# Patient Record
Sex: Male | Born: 1945 | Marital: Married | Smoking: Current every day smoker
Health system: Southern US, Community
[De-identification: ages and names within clinical notes are randomized; demographics above are authoritative.]

## PROBLEM LIST (undated history)

## (undated) DIAGNOSIS — R42 Dizziness and giddiness: Secondary | ICD-10-CM

## (undated) DIAGNOSIS — C61 Malignant neoplasm of prostate: Secondary | ICD-10-CM

## (undated) DIAGNOSIS — I10 Essential (primary) hypertension: Secondary | ICD-10-CM

## (undated) DIAGNOSIS — R0602 Shortness of breath: Secondary | ICD-10-CM

## (undated) DIAGNOSIS — H9191 Unspecified hearing loss, right ear: Secondary | ICD-10-CM

## (undated) DIAGNOSIS — E785 Hyperlipidemia, unspecified: Secondary | ICD-10-CM

## (undated) HISTORY — DX: Hyperlipidemia, unspecified: E78.5

## (undated) HISTORY — DX: Unspecified hearing loss, right ear: H91.91

## (undated) HISTORY — DX: Malignant neoplasm of prostate: C61

## (undated) HISTORY — DX: Shortness of breath: R06.02

## (undated) HISTORY — DX: Dizziness and giddiness: R42

## (undated) HISTORY — DX: Essential (primary) hypertension: I10

## (undated) HISTORY — PX: DENTAL SURGERY: SHX609

---

## 2018-08-25 LAB — BASIC METABOLIC PANEL
CO2: 23 — AB (ref 13–22)
Chloride: 108 (ref 99–108)
Creatinine: 0.9 (ref 0.6–1.3)
Potassium: 4.4 (ref 3.4–5.3)
Sodium: 140 (ref 137–147)

## 2018-08-25 LAB — HEPATIC FUNCTION PANEL
ALT: 6 — AB (ref 10–40)
AST: 10 — AB (ref 14–40)

## 2018-08-25 LAB — COMPREHENSIVE METABOLIC PANEL
Albumin: 4.1 (ref 3.5–5.0)
Calcium: 9.2 (ref 8.7–10.7)

## 2018-08-25 LAB — LIPID PANEL
Cholesterol: 117 (ref 0–200)
HDL: 40 (ref 35–70)
LDL Cholesterol: 64
LDl/HDL Ratio: 2.9
Triglycerides: 58 (ref 40–160)

## 2018-08-25 LAB — PSA: PSA: 0.1

## 2019-06-28 ENCOUNTER — Encounter: Payer: Self-pay | Admitting: Family Medicine

## 2019-06-28 ENCOUNTER — Other Ambulatory Visit: Payer: Self-pay

## 2019-06-28 ENCOUNTER — Ambulatory Visit (INDEPENDENT_AMBULATORY_CARE_PROVIDER_SITE_OTHER): Payer: Medicare HMO | Admitting: Family Medicine

## 2019-06-28 VITALS — BP 136/68 | HR 80 | Temp 98.1°F | Ht 72.0 in | Wt 185.0 lb

## 2019-06-28 DIAGNOSIS — Z122 Encounter for screening for malignant neoplasm of respiratory organs: Secondary | ICD-10-CM

## 2019-06-28 DIAGNOSIS — Z720 Tobacco use: Secondary | ICD-10-CM | POA: Insufficient documentation

## 2019-06-28 DIAGNOSIS — I6522 Occlusion and stenosis of left carotid artery: Secondary | ICD-10-CM | POA: Diagnosis not present

## 2019-06-28 DIAGNOSIS — H9190 Unspecified hearing loss, unspecified ear: Secondary | ICD-10-CM | POA: Insufficient documentation

## 2019-06-28 DIAGNOSIS — J449 Chronic obstructive pulmonary disease, unspecified: Secondary | ICD-10-CM

## 2019-06-28 DIAGNOSIS — E782 Mixed hyperlipidemia: Secondary | ICD-10-CM

## 2019-06-28 DIAGNOSIS — Z8546 Personal history of malignant neoplasm of prostate: Secondary | ICD-10-CM

## 2019-06-28 DIAGNOSIS — H9191 Unspecified hearing loss, right ear: Secondary | ICD-10-CM

## 2019-06-28 DIAGNOSIS — I10 Essential (primary) hypertension: Secondary | ICD-10-CM

## 2019-06-28 DIAGNOSIS — E785 Hyperlipidemia, unspecified: Secondary | ICD-10-CM | POA: Insufficient documentation

## 2019-06-28 DIAGNOSIS — Z87891 Personal history of nicotine dependence: Secondary | ICD-10-CM

## 2019-06-28 DIAGNOSIS — I6529 Occlusion and stenosis of unspecified carotid artery: Secondary | ICD-10-CM | POA: Insufficient documentation

## 2019-06-28 LAB — COMPREHENSIVE METABOLIC PANEL
ALT: 8 U/L (ref 0–53)
AST: 10 U/L (ref 0–37)
Albumin: 4.4 g/dL (ref 3.5–5.2)
Alkaline Phosphatase: 81 U/L (ref 39–117)
BUN: 15 mg/dL (ref 6–23)
CO2: 28 mEq/L (ref 19–32)
Calcium: 9.6 mg/dL (ref 8.4–10.5)
Chloride: 105 mEq/L (ref 96–112)
Creatinine, Ser: 0.85 mg/dL (ref 0.40–1.50)
GFR: 87.75 mL/min (ref >60.00)
Glucose, Bld: 101 mg/dL — ABNORMAL HIGH (ref 70–99)
Potassium: 4.5 mEq/L (ref 3.5–5.1)
Sodium: 139 mEq/L (ref 135–145)
Total Bilirubin: 0.9 mg/dL (ref 0.2–1.2)
Total Protein: 7 g/dL (ref 6.0–8.3)

## 2019-06-28 LAB — LIPID PANEL
Cholesterol: 126 mg/dL (ref 0–200)
HDL: 40.8 mg/dL (ref >39.00)
LDL Cholesterol: 73 mg/dL (ref 0–99)
NonHDL: 84.86
Total CHOL/HDL Ratio: 3
Triglycerides: 61 mg/dL (ref 0.0–149.0)
VLDL: 12.2 mg/dL (ref 0.0–40.0)

## 2019-06-28 LAB — PSA: PSA: 0.09 ng/mL — ABNORMAL LOW (ref 0.10–4.00)

## 2019-06-28 MED ORDER — AMLODIPINE-ATORVASTATIN 10-10 MG PO TABS: 1.0000 | ORAL_TABLET | Freq: Every day | ORAL | 3 refills | Status: DC

## 2019-06-28 MED ORDER — TAMSULOSIN HCL 0.4 MG PO CAPS: 0.4000 mg | ORAL_CAPSULE | Freq: Every day | ORAL | 3 refills | Status: DC

## 2019-06-28 NOTE — Assessment & Plan Note (Signed)
Notes improvement in congestion since quitting smoking. Has never need inhalers. Will continue to monitor and get lung cancer screening.

## 2019-06-28 NOTE — Assessment & Plan Note (Signed)
Last lipids at goal. Repeat today. Cont atrovastatin

## 2019-06-28 NOTE — Assessment & Plan Note (Signed)
Last PSA 0.1. Will repeat today

## 2019-06-28 NOTE — Patient Instructions (Signed)
#  Referral I have placed a referral to a specialist for you. You should receive a phone call from the specialty office. Make sure your voicemail is not full and that if you are able to answer your phone to unknown or new numbers.   It may take up to 2 weeks to hear about the referral. If you do not hear anything in 2 weeks, please call our office and ask to speak with the referral coordinator.    Great to meet you!  Labs today

## 2019-06-28 NOTE — Progress Notes (Signed)
Results letter to patient

## 2019-06-28 NOTE — Assessment & Plan Note (Addendum)
Reviewed last years imaging with 40-50% stenosis and recommendation for close f/u. Endorses some improvement in dizziness symptoms. Will repeat as follow-up imaging recommended. Given symptoms if worsened will refer to vascular.

## 2019-06-28 NOTE — Progress Notes (Signed)
Subjective:     Christian Gordon is a 74 y.o. male presenting for Establish Care     HPI  #Carotid Artery issue - taking a baby aspirin daily - was told to recheck - got an ultrasound - in 10/2018  #Hx of tobacco use - using a rolled up piece - chest congestion improved dramatically after stopping - was getting CXR every few years   #HTN - had a 30 day supply of medications sent, but did not want to pick this up - takes medication - does not check at home - no symptoms today  Review of Systems   Social History   Tobacco Use  Smoking Status Former Smoker  . Packs/day: 1.00  . Years: 60.00  . Pack years: 60.00  . Types: Cigarettes  . Start date: 28  . Quit date: 03/03/2019  . Years since quitting: 0.3  Smokeless Tobacco Never Used        Objective:    BP Readings from Last 3 Encounters:  06/28/19 136/68   Wt Readings from Last 3 Encounters:  06/28/19 185 lb (83.9 kg)    BP 136/68   Pulse 80   Temp 98.1 F (36.7 C) (Temporal)   Ht 6' (1.829 m)   Wt 185 lb (83.9 kg)   SpO2 95%   BMI 25.09 kg/m    Physical Exam Constitutional:      Appearance: Normal appearance. He is not ill-appearing or diaphoretic.  HENT:     Right Ear: External ear normal.     Left Ear: External ear normal.     Nose: Nose normal.  Eyes:     General: No scleral icterus.    Extraocular Movements: Extraocular movements intact.     Conjunctiva/sclera: Conjunctivae normal.  Cardiovascular:     Rate and Rhythm: Normal rate and regular rhythm.     Heart sounds: No murmur.  Pulmonary:     Effort: Pulmonary effort is normal. No respiratory distress.     Breath sounds: Normal breath sounds. No wheezing.  Musculoskeletal:     Cervical back: Neck supple.  Skin:    General: Skin is warm and dry.  Neurological:     Mental Status: He is alert. Mental status is at baseline.  Psychiatric:        Mood and Affect: Mood normal.           Assessment & Plan:   Problem  List Items Addressed This Visit      Cardiovascular and Mediastinum   Carotid artery stenosis - Primary    Reviewed last years imaging with 40-50% stenosis and recommendation for close f/u. Endorses some improvement in dizziness symptoms. Will repeat as follow-up imaging recommended. Given symptoms if worsened will refer to vascular.       Relevant Medications   amlodipine-atorvastatin (CADUET) 10-10 MG tablet   Other Relevant Orders   VAS US CAROTID   Hypertension    BP at goal. Cont amlodipine      Relevant Medications   amlodipine-atorvastatin (CADUET) 10-10 MG tablet   Other Relevant Orders   Comprehensive metabolic panel     Respiratory   COPD (chronic obstructive pulmonary disease) ([place])    Notes improvement in congestion since quitting smoking. Has never need inhalers. Will continue to monitor and get lung cancer screening.         Nervous and Auditory   Deafness     Other   History of prostate cancer    Last PSA 0.1. Will  repeat today      Relevant Medications   tamsulosin (FLOMAX) 0.4 MG CAPS capsule   Other Relevant Orders   PSA   Hyperlipidemia    Last lipids at goal. Repeat today. Cont atrovastatin      Relevant Medications   amlodipine-atorvastatin (CADUET) 10-10 MG tablet   Other Relevant Orders   Lipid panel   History of tobacco use    Quit smoking a few months ago and doing well. Discussed that he is a candidate for lung cancer screening and referral placed.       Relevant Orders   Ambulatory Referral for Lung Cancer Scre    Other Visit Diagnoses    Encounter for screening for lung cancer       Relevant Orders   Ambulatory Referral for Lung Cancer Scre       Return in about 6 months (around 12/28/2019).  [name], MD

## 2019-06-28 NOTE — Assessment & Plan Note (Signed)
BP at goal. Cont amlodipine

## 2019-06-28 NOTE — Assessment & Plan Note (Signed)
Quit smoking a few months ago and doing well. Discussed that he is a candidate for lung cancer screening and referral placed.

## 2019-06-29 ENCOUNTER — Other Ambulatory Visit: Payer: Self-pay

## 2019-06-29 DIAGNOSIS — I10 Essential (primary) hypertension: Secondary | ICD-10-CM

## 2019-06-29 DIAGNOSIS — E782 Mixed hyperlipidemia: Secondary | ICD-10-CM

## 2019-06-29 MED ORDER — AMLODIPINE-ATORVASTATIN 10-10 MG PO TABS: 1.0000 | ORAL_TABLET | Freq: Every day | ORAL | 3 refills | Status: DC

## 2019-07-05 ENCOUNTER — Telehealth: Payer: Self-pay | Admitting: *Deleted

## 2019-07-05 DIAGNOSIS — Z122 Encounter for screening for malignant neoplasm of respiratory organs: Secondary | ICD-10-CM

## 2019-07-05 DIAGNOSIS — Z87891 Personal history of nicotine dependence: Secondary | ICD-10-CM

## 2019-07-05 NOTE — Telephone Encounter (Signed)
Received referral for initial lung cancer screening scan. Contacted patient and obtained smoking history,(former, quit 03/03/19, 60 pack year) as well as answering questions related to screening process. Patient denies signs of lung cancer such as weight loss or hemoptysis. Patient denies comorbidity that would prevent curative treatment if lung cancer were found. Patient is scheduled for shared decision making visit and CT scan on 07/21/19 at 59f15am.

## 2019-07-21 ENCOUNTER — Ambulatory Visit
Admission: RE | Admit: 2019-07-21 | Discharge: 2019-07-21 | Disposition: A | Discharge status: Home / Self Care | Payer: Medicare HMO | Source: Ambulatory Visit | Attending: Oncology | Admitting: Oncology

## 2019-07-21 ENCOUNTER — Other Ambulatory Visit: Payer: Self-pay

## 2019-07-21 ENCOUNTER — Inpatient Hospital Stay: Payer: Medicare HMO | Attending: Oncology | Admitting: Oncology

## 2019-07-21 DIAGNOSIS — Z87891 Personal history of nicotine dependence: Secondary | ICD-10-CM

## 2019-07-21 DIAGNOSIS — Z122 Encounter for screening for malignant neoplasm of respiratory organs: Secondary | ICD-10-CM | POA: Insufficient documentation

## 2019-07-21 NOTE — Progress Notes (Signed)
Virtual Visit via Video Note  I connected with [name] on 07/21/19 at 10:15 AM EDT by a video enabled telemedicine application and verified that I am speaking with the correct person using two identifiers.  Location: Patient: [facility] Provider: Clinic    I discussed the limitations of evaluation and management by telemedicine and the availability of in person appointments. The patient expressed understanding and agreed to proceed.  I discussed the assessment and treatment plan with the patient. The patient was provided an opportunity to ask questions and all were answered. The patient agreed with the plan and demonstrated an understanding of the instructions.   The patient was advised to call back or seek an in-person evaluation if the symptoms worsen or if the condition fails to improve as anticipated.   In accordance with CMS guidelines, patient has met eligibility criteria including age, absence of signs or symptoms of lung cancer.  Social History   Tobacco Use  . Smoking status: Former Smoker    Packs/day: 1.00    Years: 60.00    Pack years: 60.00    Types: Cigarettes    Start date: 23    Quit date: 03/03/2019    Years since quitting: 0.3  . Smokeless tobacco: Never Used  Vaping Use  . Vaping Use: Never used  Substance Use Topics  . Alcohol use: Not Currently  . Drug use: Never      A shared decision-making session was conducted prior to the performance of CT scan. This includes one or more decision aids, includes benefits and harms of screening, follow-up diagnostic testing, over-diagnosis, false positive rate, and total radiation exposure.   Counseling on the importance of adherence to annual lung cancer LDCT screening, impact of co-morbidities, and ability or willingness to undergo diagnosis and treatment is imperative for compliance of the program.   Counseling on the importance of continued smoking cessation for former smokers; the importance of smoking cessation for  current smokers, and information about tobacco cessation interventions have been given to patient including [place] and 1800 quit [place] programs.   Written order for lung cancer screening with LDCT has been given to the patient and any and all questions have been answered to the best of my abilities.    Yearly follow up will be coordinated by Burgess Estelle, Thoracic Navigator.  I provided 15 minutes of face-to-face video visit time during this encounter, and > 50% was spent counseling as documented under my assessment & plan.   Jacquelin Hawking, NP

## 2019-07-22 ENCOUNTER — Telehealth: Payer: Self-pay | Admitting: *Deleted

## 2019-07-22 DIAGNOSIS — I7090 Unspecified atherosclerosis: Secondary | ICD-10-CM

## 2019-07-22 DIAGNOSIS — K769 Liver disease, unspecified: Secondary | ICD-10-CM

## 2019-07-22 NOTE — Telephone Encounter (Signed)
Please call patient to let him know that we can discuss liver and kidney findings at his next office visit. Not very worrisome so it could wait 6 months, but he can schedule an earlier visit if he would like.

## 2019-07-22 NOTE — Telephone Encounter (Signed)

## 2019-07-28 ENCOUNTER — Telehealth: Payer: Self-pay

## 2019-07-28 NOTE — Telephone Encounter (Signed)
Left message on pt's cell phone vm to return my call about Dr. Verda Cumins note.

## 2019-07-30 ENCOUNTER — Telehealth: Payer: Self-pay

## 2019-07-30 NOTE — Telephone Encounter (Signed)
Spoke to pt and relayed Dr. Verda Cumins message to him. He does not have any worries at this time and says he will wait the 6 months unless something comes up.

## 2019-08-05 ENCOUNTER — Other Ambulatory Visit: Payer: Self-pay

## 2019-08-05 ENCOUNTER — Ambulatory Visit (INDEPENDENT_AMBULATORY_CARE_PROVIDER_SITE_OTHER): Payer: Medicare HMO

## 2019-08-05 DIAGNOSIS — I6522 Occlusion and stenosis of left carotid artery: Secondary | ICD-10-CM

## 2019-10-06 ENCOUNTER — Encounter: Payer: Self-pay | Admitting: Family Medicine

## 2019-10-06 ENCOUNTER — Ambulatory Visit (INDEPENDENT_AMBULATORY_CARE_PROVIDER_SITE_OTHER): Payer: Medicare HMO | Admitting: Family Medicine

## 2019-10-06 ENCOUNTER — Other Ambulatory Visit: Payer: Self-pay

## 2019-10-06 VITALS — BP 138/78 | HR 74 | Temp 98.0°F | Ht 72.0 in | Wt 185.2 lb

## 2019-10-06 DIAGNOSIS — M21611 Bunion of right foot: Secondary | ICD-10-CM | POA: Diagnosis not present

## 2019-10-06 DIAGNOSIS — L299 Pruritus, unspecified: Secondary | ICD-10-CM | POA: Insufficient documentation

## 2019-10-06 DIAGNOSIS — I10 Essential (primary) hypertension: Secondary | ICD-10-CM

## 2019-10-06 DIAGNOSIS — I771 Stricture of artery: Secondary | ICD-10-CM | POA: Diagnosis not present

## 2019-10-06 MED ORDER — TRIAMCINOLONE ACETONIDE 0.025 % EX CREA: TOPICAL_CREAM | Freq: Three times a day (TID) | CUTANEOUS | 2 refills | Status: DC

## 2019-10-06 NOTE — Assessment & Plan Note (Signed)
No rash, resolves with triamcinolone. Scrotum - knows to use sparingly

## 2019-10-06 NOTE — Assessment & Plan Note (Signed)
BP stable. Cont current medication

## 2019-10-06 NOTE — Patient Instructions (Addendum)
Bunion  - referral to podiatry  I have placed a referral to a specialist for you. You should receive a phone call from the specialty office. Make sure your voicemail is not full and that if you are able to answer your phone to unknown or new numbers.   It may take up to 2 weeks to hear about the referral. If you do not hear anything in 2 weeks, please call our office and ask to speak with the referral coordinator.    Right artery - if your right arm starts to have numbness or tingling

## 2019-10-06 NOTE — Assessment & Plan Note (Signed)
Requests referral to podiatry to discuss management as getting blisters/callus

## 2019-10-06 NOTE — Progress Notes (Signed)
Subjective:     Christian Gordon is a 74 y.o. male presenting for Follow-up and Referral (podiatrist )     HPI  #Right foot bunion - developing callouses and having to address them every time  Started taking a veggie and fruit supplement which is helping him feel overall better  #right subclavian stenosis - no numbness, weakness, tingling on the right arm  #pruritis - on the scrotum and axilla - no rash - has failed other options - tries to use daily 3-4 times per week   Review of Systems   Social History   Tobacco Use  Smoking Status Former Smoker  . Packs/day: 1.00  . Years: 60.00  . Pack years: 60.00  . Types: Cigarettes  . Start date: 56  . Quit date: 03/03/2019  . Years since quitting: 0.5  Smokeless Tobacco Never Used        Objective:    BP Readings from Last 3 Encounters:  10/06/19 138/78  06/28/19 136/68   Wt Readings from Last 3 Encounters:  10/06/19 185 lb 4 oz (84 kg)  07/21/19 185 lb (83.9 kg)  06/28/19 185 lb (83.9 kg)    BP 138/78 (BP Location: Left Arm, Cuff Size: Normal)   Pulse 74   Temp 98 F (36.7 C) (Temporal)   Ht 6' (1.829 m)   Wt 185 lb 4 oz (84 kg)   SpO2 94%   BMI 25.12 kg/m    Physical Exam Constitutional:      Appearance: Normal appearance. He is not ill-appearing or diaphoretic.  HENT:     Right Ear: External ear normal.     Left Ear: External ear normal.     Nose: Nose normal.  Eyes:     General: No scleral icterus.    Extraocular Movements: Extraocular movements intact.     Conjunctiva/sclera: Conjunctivae normal.  Cardiovascular:     Rate and Rhythm: Normal rate.  Pulmonary:     Effort: Pulmonary effort is normal.  Musculoskeletal:     Cervical back: Neck supple.     Right foot: Bunion present.  Skin:    General: Skin is warm and dry.  Neurological:     Mental Status: He is alert. Mental status is at baseline.  Psychiatric:        Mood and Affect: Mood normal.           Assessment &  Plan:   Problem List Items Addressed This Visit      Cardiovascular and Mediastinum   Hypertension    BP stable. 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 BP on Right and left within 10 points and pulse similar. No sign of worsening BP on the right vs left. No symptoms. Cont to monitor. Repeat imaging next year        Musculoskeletal and Integument   Bunion of right foot    Requests referral to podiatry to discuss management as getting blisters/callus      Relevant Orders   Ambulatory referral to Podiatry   Pruritus    No rash, resolves with triamcinolone. Scrotum - knows to use sparingly       Relevant Medications   triamcinolone (KENALOG) 0.025 % cream       Return in about 6 months (around 04/04/2020).  [name], MD  This visit occurred during the SARS-CoV-2 public health emergency.  Safety protocols were in place,  including screening questions prior to the visit, additional usage of staff PPE, and extensive cleaning of exam room while observing appropriate contact time as indicated for disinfecting solutions.

## 2019-10-06 NOTE — Assessment & Plan Note (Signed)
Noted on [place] in July. BP on Right and left within 10 points and pulse similar. No sign of worsening BP on the right vs left. No symptoms. Cont to monitor. Repeat imaging next year

## 2019-10-28 ENCOUNTER — Ambulatory Visit (INDEPENDENT_AMBULATORY_CARE_PROVIDER_SITE_OTHER): Payer: Medicare HMO | Admitting: Podiatry

## 2019-10-28 ENCOUNTER — Encounter: Payer: Self-pay | Admitting: Podiatry

## 2019-10-28 ENCOUNTER — Other Ambulatory Visit: Payer: Self-pay

## 2019-10-28 DIAGNOSIS — M7751 Other enthesopathy of right foot: Secondary | ICD-10-CM

## 2019-10-28 NOTE — Progress Notes (Signed)
  Subjective:  Patient ID: Christian Gordon, male    DOB: September 23, 1945,  MRN: [id number]  Chief Complaint  Patient presents with  . Callouses    Patient presents today for painful callous right hallux at bunion x 8-9 months    73 y.o. male presents with the above complaint.  Patient presents with complaint of right first metatarsophalangeal joint capsulitis with underlying bunion deformity.  Patient states that he does not want bunion addressed any surgical options.  He has been going for 8 to 9 months as progressing on worse.  He would like to have them debrided down.  He denies any other acute complaints.  He has not seen anyone else prior to seeing me.  He states that he is going to [place] soon.  He would like to make sure that he does not have any pain.  He denies any other acute complaints.   Review of Systems: Negative except as noted in the HPI. Denies N/V/F/Ch.  Past Medical History:  Diagnosis Date  . Deaf, right   . Dizzinesses   . Hyperlipidemia   . Hypertension   . Prostate cancer ([place])   . SOB (shortness of breath)     Current Outpatient Medications:  .  amlodipine-atorvastatin (CADUET) 10-10 MG tablet, Take 1 tablet by mouth daily., Disp: 90 tablet, Rfl: 3 .  tamsulosin (FLOMAX) 0.4 MG CAPS capsule, Take 1 capsule (0.4 mg total) by mouth daily., Disp: 90 capsule, Rfl: 3 .  triamcinolone (KENALOG) 0.025 % cream, Apply topically 3 (three) times daily., Disp: 30 g, Rfl: 2  Social History   Tobacco Use  Smoking Status Former Smoker  . Packs/day: 1.00  . Years: 60.00  . Pack years: 60.00  . Types: Cigarettes  . Start date: 5  . Quit date: 03/03/2019  . Years since quitting: 0.6  Smokeless Tobacco Never Used    No Known Allergies Objective:  There were no vitals filed for this visit. There is no height or weight on file to calculate BMI. Constitutional Well developed. Well nourished.  Vascular Dorsalis pedis pulses palpable bilaterally. Posterior tibial  pulses palpable bilaterally. Capillary refill normal to all digits.  No cyanosis or clubbing noted. Pedal hair growth normal.  Neurologic Normal speech. Oriented to person, place, and time. Epicritic sensation to light touch grossly present bilaterally.  Dermatologic Nails well groomed and normal in appearance. No open wounds. No skin lesions.  Orthopedic:  Pain on palpation to the right first metatarsophalangeal joint at the site of bunion deformity.  Hypertrophy of medial eminence noted.  Mild pain on palpation to that as well.  Mild pain with range of motion of first MPJ.  No intra-articular pain noted.   Radiographs: None Assessment:   1. Capsulitis of metatarsophalangeal (MTP) joint of right foot    Plan:  Patient was evaluated and treated and all questions answered.  Right first metatarsophalangeal joint capsulitis -I explained to patient the etiology of capsulitis and various treatment options were extensively discussed.  I believe patient will benefit from a steroid injection to help decrease the acute inflammatory component associated with pain.  Patient agrees with the plan would like to proceed with a steroid injection -A steroid injection was performed at right first MTPJ joint using 1% plain Lidocaine and 10 mg of Kenalog. This was well tolerated.   No follow-ups on file.

## 2019-11-11 ENCOUNTER — Telehealth: Payer: Self-pay

## 2019-11-11 ENCOUNTER — Ambulatory Visit
Admission: EM | Admit: 2019-11-11 | Discharge: 2019-11-11 | Disposition: A | Discharge status: Home / Self Care | Payer: Medicare HMO | Attending: Family Medicine | Admitting: Family Medicine

## 2019-11-11 DIAGNOSIS — R062 Wheezing: Secondary | ICD-10-CM | POA: Diagnosis not present

## 2019-11-11 DIAGNOSIS — R059 Cough, unspecified: Secondary | ICD-10-CM

## 2019-11-11 DIAGNOSIS — J069 Acute upper respiratory infection, unspecified: Secondary | ICD-10-CM | POA: Diagnosis not present

## 2019-11-11 MED ORDER — BENZONATATE 100 MG PO CAPS: 100.0000 mg | ORAL_CAPSULE | Freq: Three times a day (TID) | ORAL | 0 refills | Status: DC

## 2019-11-11 MED ORDER — DOXYCYCLINE HYCLATE 100 MG PO CAPS: 100.0000 mg | ORAL_CAPSULE | Freq: Two times a day (BID) | ORAL | 0 refills | Status: DC

## 2019-11-11 MED ORDER — PREDNISONE 10 MG (21) PO TBPK: ORAL_TABLET | Freq: Every day | ORAL | 0 refills | Status: AC

## 2019-11-11 NOTE — Telephone Encounter (Signed)
Agree with need for appointment. Appreciate triage

## 2019-11-11 NOTE — ED Notes (Signed)
Provider notified of patients elevated BP.

## 2019-11-11 NOTE — ED Provider Notes (Signed)
[facility]   [id number] 11/11/19 Arrival Time: 6213   CC: COVID symptoms  SUBJECTIVE: History from: patient.  Christian Gordon is a 74 y.o. male who presents with abrupt onset of nasal congestion, PND, and persistent dry cough for the last week. Reports that he was sweating and then went outside in the cool weather in [place] last week. Denies sick exposure to COVID, flu or strep. Denies recent travel. Has negative history of Covid. Has completed Covid vaccines. Has not taken OTC medications for this. There are no aggravating or alleviating factors. Has hx COPD and tobacco use. Denies previous symptoms in the past. Denies fever, chills, fatigue, sinus pain, rhinorrhea, sore throat, chest pain, nausea, changes in bowel or bladder habits.    ROS: As per HPI.  All other pertinent ROS negative.     Past Medical History:  Diagnosis Date  . Deaf, right   . Dizzinesses   . Hyperlipidemia   . Hypertension   . Prostate cancer ([place])   . SOB (shortness of breath)    Past Surgical History:  Procedure Laterality Date  . DENTAL SURGERY     No Known Allergies No current facility-administered medications on file prior to encounter.   Current Outpatient Medications on File Prior to Encounter  Medication Sig Dispense Refill  . amlodipine-atorvastatin (CADUET) 10-10 MG tablet Take 1 tablet by mouth daily. 90 tablet 3  . tamsulosin (FLOMAX) 0.4 MG CAPS capsule Take 1 capsule (0.4 mg total) by mouth daily. 90 capsule 3  . triamcinolone (KENALOG) 0.025 % cream Apply topically 3 (three) times daily. 30 g 2   Social History   Socioeconomic History  . Marital status: Married    Spouse name: [name] [place]  . Number of children: 1  . Years of education: some college  . Highest education level: Not on file  Occupational History  . Not on file  Tobacco Use  . Smoking status: Former Smoker    Packs/day: 1.00    Years: 60.00    Pack years: 60.00    Types: Cigarettes    Start  date: 67    Quit date: 03/03/2019    Years since quitting: 0.6  . Smokeless tobacco: Never Used  Vaping Use  . Vaping Use: Never used  Substance and Sexual Activity  . Alcohol use: Yes    Alcohol/week: 1.0 standard drink    Types: 1 Cans of beer per week    Comment: one beer per month   . Drug use: Never  . Sexual activity: Not Currently    Partners: Female    Birth control/protection: None, Post-menopausal  Other Topics Concern  . Not on file  Social History Narrative   Left handed   Lives with wife, in a [address]   From [place]      06/28/19   From: [place] moved to [facility] Feb 2021   Living: with partner [name]   Work: retired Medical illustrator      Family: Son - [place], step grandchildren      Enjoys: watch baseball, casinos       Exercise: not currently   Diet: [place] food, eating more fruit/veggies      Safety   Seat belts: Yes    Guns: Yes  and secure   Safe in relationships: Yes    Social Determinants of Radio broadcast assistant Strain:   . Difficulty of Paying Living Expenses: Not on file  Food Insecurity:   .  Worried About Charity fundraiser in the Last Year: Not on file  . Ran Out of Food in the Last Year: Not on file  Transportation Needs:   . Lack of Transportation (Medical): Not on file  . Lack of Transportation (Non-Medical): Not on file  Physical Activity:   . Days of Exercise per Week: Not on file  . Minutes of Exercise per Session: Not on file  Stress:   . Feeling of Stress : Not on file  Social Connections:   . Frequency of Communication with Friends and Family: Not on file  . Frequency of Social Gatherings with Friends and Family: Not on file  . Attends Religious Services: Not on file  . Active Member of Clubs or Organizations: Not on file  . Attends Archivist Meetings: Not on file  . Marital Status: Not on file  Intimate Partner Violence:   . Fear of Current or Ex-Partner: Not on file  .  Emotionally Abused: Not on file  . Physically Abused: Not on file  . Sexually Abused: Not on file   Family History  Problem Relation Age of Onset  . Heart disease Mother   . Heart disease Father   . Hypertension Father     OBJECTIVE:  Vitals:   11/11/19 1113 11/11/19 1117  BP: (!) 159/84 (S) (!) 156/84  Pulse: 81   Resp: (!) 24   Temp: 97.7 F (36.5 C)   SpO2: 93%      General appearance: alert; appears fatigued, but nontoxic; speaking in full sentences and tolerating own secretions HEENT: NCAT; Ears: EACs clear, TMs pearly gray; Eyes: PERRL.  EOM grossly intact. Sinuses: nontender; Nose: nares patent without rhinorrhea, Throat: oropharynx clear, tonsils non erythematous or enlarged, uvula midline  Neck: supple without LAD Lungs: unlabored respirations, symmetrical air entry; cough: moderate; no respiratory distress; coarse lung sounds throughout bilateral lung fields, wheezing noted as well throughout Heart: regular rate and rhythm.  Radial pulses 2+ symmetrical bilaterally Skin: warm and dry Psychological: alert and cooperative; normal mood and affect  LABS:  No results found for this or any previous visit (from the past 24 hour(s)).   ASSESSMENT & PLAN:  1. Upper respiratory tract infection, unspecified type   2. Cough   3. Wheezing     Meds ordered this encounter  Medications  . predniSONE (STERAPRED UNI-PAK 21 TAB) 10 MG (21) TBPK tablet    Sig: Take by mouth daily for 6 days. Take 6 tablets on day 1, 5 tablets on day 2, 4 tablets on day 3, 3 tablets on day 4, 2 tablets on day 5, 1 tablet on day 6    Dispense:  21 tablet    Refill:  0    Order Specific Question:   Supervising Provider    Answer:   Chase Picket [id number]  . doxycycline (VIBRAMYCIN) 100 MG capsule    Sig: Take 1 capsule (100 mg total) by mouth 2 (two) times daily.    Dispense:  14 capsule    Refill:  0    Order Specific Question:   Supervising Provider    Answer:   Chase Picket  [id number]  . benzonatate (TESSALON) 100 MG capsule    Sig: Take 1 capsule (100 mg total) by mouth every 8 (eight) hours.    Dispense:  21 capsule    Refill:  0    Order Specific Question:   Supervising Provider    Answer:   Chase Picket [  [id number]]    Most likely COPD exacerbation from URI Prescribed doxycycline Prescribed steroid taper Prescribed tessalon perles as needed for cough  COVID testing ordered.  It will take between 1-2 days for test results.  Someone will contact you regarding abnormal results.    Patient should remain in quarantine until they have received Covid results.  If negative you may resume normal activities (go back to work/school) while practicing hand hygiene, social distance, and mask wearing.  If positive, patient should remain in quarantine for 10 days from symptom onset AND greater than 72 hours after symptoms resolution (absence of fever without the use of fever-reducing medication and improvement in respiratory symptoms), whichever is longer Get plenty of rest and push fluids Use OTC zyrtec for nasal congestion, runny nose, and/or sore throat Use OTC flonase for nasal congestion and runny nose Use medications daily for symptom relief Use OTC medications like ibuprofen or tylenol as needed fever or pain Call or go to the ED if you have any new or worsening symptoms such as fever, worsening cough, shortness of breath, chest tightness, chest pain, turning blue, changes in mental status.  Reviewed expectations re: course of current medical issues. Questions answered. Outlined signs and symptoms indicating need for more acute intervention. Patient verbalized understanding. After Visit Summary given.         Faustino Congress, NP 11/11/19 1135

## 2019-11-11 NOTE — ED Triage Notes (Signed)
Pt reports cough, congestion, and runny nose since last Thursday,  denies fever.

## 2019-11-11 NOTE — Telephone Encounter (Signed)
Patient and wife call in this morning reporting cough, congestion, wheezing and SOB. Patient was transferred to triage nurse ([place]) for assessment.    They (per patient) told him to go to the pharmacy and get some "cough medicine" and then cold transferred them back into our call que.  Patient and wife were not happy with this and feel like patient needs to be seen.  Patient; however, just wants meds called in.  Wife is concerned.   I spoke with patient and wife:   Patient was not wheezing or SOB on the phone but does report that his symptoms are worse at night.   Sx's X 4 days - has been taking OTC cough/cold medicine and advil.  Cough min productive clear sputum Chest and Head congestion Wheezing SOB worse at night but currently absent. Afebrile Denies ear pain or sore throat  He did recently fly in the past week.  Has had COVID vaccination, No known exposures Has COPD.    Given patient's symptoms and medical history, patient does need an evaluation before medication can be given.  Since patient is not actively or progressively worsening with SOB, I did offer them 2 options: 1.  Virtual with car side sick care as appropriate today with PCP Dr. Einar Pheasant or 2.  Could go on to urgent now (10am) for evaluation.  Wife and patient discussed and decided to go on to urgent care now.    FYI to PCP

## 2019-11-11 NOTE — Discharge Instructions (Addendum)
I have sent in a prednisone taper for you to take for 6 days. 6 tablets on day one, 5 tablets on day two, 4 tablets on day three, 3 tablets on day four, 2 tablets on day five, and 1 tablet on day six.  I have sent in [place] for you to take one capsule every 8 hours as needed for cough  I have sent in doxycycline for you to take twice a day for 7 days  Follow up with this office as needed or with primary care  Your COVID test is pending.  You should self quarantine until the test result is back.    Take Tylenol as needed for fever or discomfort.  Rest and keep yourself hydrated.    Go to the emergency department if you develop acute worsening symptoms.

## 2019-11-13 LAB — NOVEL CORONAVIRUS, NAA: SARS-CoV-2, NAA: NOT DETECTED

## 2019-11-13 LAB — SARS-COV-2, NAA 2 DAY TAT

## 2019-12-15 ENCOUNTER — Ambulatory Visit: Payer: Medicare HMO | Admitting: Family Medicine

## 2019-12-15 ENCOUNTER — Telehealth: Payer: Self-pay | Admitting: Family Medicine

## 2019-12-15 DIAGNOSIS — Z8546 Personal history of malignant neoplasm of prostate: Secondary | ICD-10-CM

## 2019-12-15 MED ORDER — TAMSULOSIN HCL 0.4 MG PO CAPS: 0.8000 mg | ORAL_CAPSULE | Freq: Every day | ORAL | 3 refills | Status: DC

## 2019-12-15 NOTE — Telephone Encounter (Signed)
Reported have difficulty urinating.   He increased his dose of tamsulosin with improvement in symptoms  Sending in a higher dose of the medication to continue 0.8 mg daily

## 2020-02-29 ENCOUNTER — Encounter: Payer: Self-pay | Admitting: Podiatry

## 2020-02-29 ENCOUNTER — Other Ambulatory Visit: Payer: Self-pay

## 2020-02-29 ENCOUNTER — Ambulatory Visit (INDEPENDENT_AMBULATORY_CARE_PROVIDER_SITE_OTHER): Payer: Medicare HMO | Admitting: Podiatry

## 2020-02-29 DIAGNOSIS — M79674 Pain in right toe(s): Secondary | ICD-10-CM

## 2020-02-29 DIAGNOSIS — B351 Tinea unguium: Secondary | ICD-10-CM

## 2020-02-29 DIAGNOSIS — L909 Atrophic disorder of skin, unspecified: Secondary | ICD-10-CM

## 2020-02-29 DIAGNOSIS — M79675 Pain in left toe(s): Secondary | ICD-10-CM | POA: Diagnosis not present

## 2020-02-29 NOTE — Progress Notes (Signed)
  Subjective:  Patient ID: Christian Gordon, male    DOB: 1945-04-28,  MRN: [id number]  Chief Complaint  Patient presents with  . Callouses    Patient presents today for painful callous lesions to right heel and forefoot x 3-4 months.  He says "it feels like im stepping on a nail"   75 y.o. male returns for the above complaint. Patient presents with complaint of thickened elongated dystrophic toenails x10.  Patient would like to have them debrided down.  He is not a diabetic.  He states that his not able to do it himself.  He denies any other acute complaints.  He also has secondary complaint of bilateral heel pain but not plantar fasciitis.  Patient states that he feels like his walking on his heel there is some bruising associated with it.  He has not tried any treatment options.  He denies any other acute complaints.  Objective:  There were no vitals filed for this visit. Podiatric Exam: Vascular: dorsalis pedis and posterior tibial pulses are palpable bilateral. Capillary return is immediate. Temperature gradient is WNL. Skin turgor WNL  Sensorium: Normal Semmes Weinstein monofilament test. Normal tactile sensation bilaterally. Nail Exam: Pt has thick disfigured discolored nails with subungual debris noted bilateral entire nail hallux through fifth toenails.  Pain on palpation to the nails. Ulcer Exam: There is no evidence of ulcer or pre-ulcerative changes or infection. Orthopedic Exam: Muscle tone and strength are WNL. No limitations in general ROM. No crepitus or effusions noted. HAV  B/L.  Hammer toes 2-5  B/L. Skin: No Porokeratosis. No infection or ulcers plantar fat.  Atrophy noted to bilateral heel with right greater than left side.  Patient has a cavovarus foot structure that seems to put excessive pressure in the forefoot on the plantar heel.    Assessment & Plan:   1. Plantar fat pad atrophy   2. Pain due to onychomycosis of toenails of both feet     Patient was evaluated  and treated and all questions answered.  Fat pad atrophy bilateral heel -I explained to the patient the etiology of atrophy and various treatment options were extensively discussed.  Given the foot structure that he had with pes cavus with excessive pressure to the heel on the ball the foot patient's fat pad is displacing leading to walking more on the bone therefore leading to pain.  Patient does not have any symptomatic pain due to plantar fasciitis. -Heel pads were dispensed to bilateral foot  Onychomycosis with pain  -Nails palliatively debrided as below. -Educated on self-care  Procedure: Nail Debridement Rationale: pain  Type of Debridement: manual, sharp debridement. Instrumentation: Nail nipper, rotary burr. Number of Nails: 10  Procedures and Treatment: Consent by patient was obtained for treatment procedures. The patient understood the discussion of treatment and procedures well. All questions were answered thoroughly reviewed. Debridement of mycotic and hypertrophic toenails, 1 through 5 bilateral and clearing of subungual debris. No ulceration, no infection noted.  Return Visit-Office Procedure: Patient instructed to return to the office for a follow up visit 3 months for continued evaluation and treatment.  Boneta Lucks, DPM    No follow-ups on file.

## 2020-04-06 ENCOUNTER — Ambulatory Visit (INDEPENDENT_AMBULATORY_CARE_PROVIDER_SITE_OTHER): Payer: Medicare HMO | Admitting: Internal Medicine

## 2020-04-06 ENCOUNTER — Other Ambulatory Visit: Payer: Self-pay

## 2020-04-06 VITALS — BP 142/96 | HR 77 | Temp 98.4°F | Wt 185.0 lb

## 2020-04-06 DIAGNOSIS — L299 Pruritus, unspecified: Secondary | ICD-10-CM

## 2020-04-06 LAB — COMPREHENSIVE METABOLIC PANEL
ALT: 10 U/L (ref 0–53)
AST: 13 U/L (ref 0–37)
Albumin: 4.1 g/dL (ref 3.5–5.2)
Alkaline Phosphatase: 61 U/L (ref 39–117)
BUN: 12 mg/dL (ref 6–23)
CO2: 25 mEq/L (ref 19–32)
Calcium: 9.4 mg/dL (ref 8.4–10.5)
Chloride: 105 mEq/L (ref 96–112)
Creatinine, Ser: 1 mg/dL (ref 0.40–1.50)
GFR: 73.23 mL/min (ref >60.00)
Glucose, Bld: 95 mg/dL (ref 70–99)
Potassium: 4.1 mEq/L (ref 3.5–5.1)
Sodium: 138 mEq/L (ref 135–145)
Total Bilirubin: 0.8 mg/dL (ref 0.2–1.2)
Total Protein: 7.2 g/dL (ref 6.0–8.3)

## 2020-04-06 MED ORDER — PREDNISONE 10 MG PO TABS: ORAL_TABLET | ORAL | 0 refills | Status: DC

## 2020-04-06 NOTE — Patient Instructions (Signed)
Pruritus Pruritus is an itchy feeling on the skin. One of the most common causes is dry skin, but many different things can cause itching. Most cases of itching do not require medical attention. Sometimes itchy skin can turn into a rash. Follow these instructions at home: Skin care  Apply moisturizing lotion to your skin as needed. Lotion that contains petroleum jelly is best.  Take medicines or apply medicated creams only as told by your health care provider. This may include: ? Corticosteroid cream. ? Anti-itch lotions. ? Oral antihistamines.  Apply a cool, wet cloth (cool compress) to the affected areas.  Take baths with one of the following: ? Epsom salts. You can get these at your local pharmacy or grocery store. Follow the instructions on the packaging. ? Baking soda. Pour a small amount into the bath as told by your health care provider. ? Colloidal oatmeal. You can get this at your local pharmacy or grocery store. Follow the instructions on the packaging.  Apply baking soda paste to your skin. To make the paste, stir water into a small amount of baking soda until it reaches a paste-like consistency.  Do not scratch your skin.  Do not take hot showers or baths, which can make itching worse. A cool shower may help with itching as long as you apply moisturizing lotion after the shower.  Do not use scented soaps, detergents, perfumes, and cosmetic products. Instead, use gentle, unscented versions of these items.   General instructions  Avoid wearing tight clothes.  Keep a journal to help find out what is causing your itching. Write down: ? What you eat and drink. ? What cosmetic products you use. ? What soaps or detergents you use. ? What you wear, including jewelry.  Use a humidifier. This keeps the air moist, which helps to prevent dry skin.  Be aware of any changes in your itchiness. Contact a health care provider if:  The itching does not go away after several  days.  You are unusually thirsty or urinating more than normal.  Your skin tingles or feels numb.  Your skin or the white parts of your eyes turn yellow (jaundice).  You feel weak.  You have any of the following: ? Night sweats. ? Tiredness (fatigue). ? Weight loss. ? Abdominal pain. Summary  Pruritus is an itchy feeling on the skin. One of the most common causes is dry skin, but many different conditions and factors can cause itching.  Apply moisturizing lotion to your skin as needed. Lotion that contains petroleum jelly is best.  Take medicines or apply medicated creams only as told by your health care provider.  Do not take hot showers or baths. Do not use scented soaps, detergents, perfumes, or cosmetic products. This information is not intended to replace advice given to you by your health care provider. Make sure you discuss any questions you have with your health care provider. Document Revised: 01/21/2017 Document Reviewed: 01/21/2017 Elsevier Patient Education  2021 Elsevier Inc.  

## 2020-04-06 NOTE — Progress Notes (Signed)
Subjective:    Patient ID: Christian Gordon, male    DOB: [birth year], 75 y.o.   MRN: [id number]  HPI  Patient presents the clinic today with complaint of generalized itching.  He reports this started 6 weeks ago.  He has not noticed any rash of his body.  He reports he scratches to the point where he draws blood on his skin.  He denies recent changes in medication, soaps, lotions or detergents.  He did try a powdered ice tea about the time this itching started but he stopped this 2 days ago.  He reports there are no new animals at the house. No one in the home has a similar issue.  He has tried Benadryl and hydrocortisone OTC with minimal relief of symptoms.  Review of Systems  Past Medical History:  Diagnosis Date   Deaf, right    Dizzinesses    Hyperlipidemia    Hypertension    Prostate cancer ([place])    SOB (shortness of breath)     Current Outpatient Medications  Medication Sig Dispense Refill   amlodipine-atorvastatin (CADUET) 10-10 MG tablet Take 1 tablet by mouth daily. 90 tablet 3   tamsulosin (FLOMAX) 0.4 MG CAPS capsule Take 2 capsules (0.8 mg total) by mouth daily. 180 capsule 3   triamcinolone (KENALOG) 0.025 % cream Apply topically 3 (three) times daily. 30 g 2   No current facility-administered medications for this visit.    No Known Allergies  Family History  Problem Relation Age of Onset   Heart disease Mother    Heart disease Father    Hypertension Father     Social History   Socioeconomic History   Marital status: Married    Spouse name: [name] Serocki   Number of children: 1   Years of education: some college   Highest education level: Not on file  Occupational History   Not on file  Tobacco Use   Smoking status: Former Smoker    Packs/day: 1.00    Years: 60.00    Pack years: 60.00    Types: Cigarettes    Start date: 66    Quit date: 03/03/2019    Years since quitting: 1.0   Smokeless tobacco: Never Used  Vaping Use    Vaping Use: Never used  Substance and Sexual Activity   Alcohol use: Yes    Alcohol/week: 1.0 standard drink    Types: 1 Cans of beer per week    Comment: one beer per month    Drug use: Never   Sexual activity: Not Currently    Partners: Female    Birth control/protection: None, Post-menopausal  Other Topics Concern   Not on file  Social History Narrative   Left handed   Lives with wife, in a condo   From [place]      06/28/19   From: [place] moved to [facility] Feb 2021   Living: with partner [name]   Work: retired Medical illustrator      Family: Son - [place], step grandchildren      Enjoys: watch baseball, casinos       Exercise: not currently   Diet: [place] food, eating more fruit/veggies      Safety   Seat belts: Yes    Guns: Yes  and secure   Safe in relationships: Yes    Social Determinants of Radio broadcast assistant Strain: Not on file  Food Insecurity: Not on file  Transportation Needs:  Not on file  Physical Activity: Not on file  Stress: Not on file  Social Connections: Not on file  Intimate Partner Violence: Not on file     Constitutional: Denies fever, malaise, fatigue, headache or abrupt weight changes.  HEENT: Denies eye pain, eye redness, ear pain, ringing in the ears, wax buildup, runny nose, nasal congestion, bloody nose, or sore throat. Respiratory: Denies difficulty breathing, shortness of breath, cough or sputum production.   Cardiovascular: Denies chest pain, chest tightness, palpitations or swelling in the hands or feet.  Skin: Patient reports generalized itching.  Denies redness, rashes, lesions or ulcercations.    No other specific complaints in a complete review of systems (except as listed in HPI above).     Objective:   Physical Exam  BP (!) 142/96    Pulse 77    Temp 98.4 F (36.9 C) (Temporal)    Wt 185 lb (83.9 kg)    SpO2 98%    BMI 25.09 kg/m   Wt Readings from Last 3 Encounters:  10/06/19  185 lb 4 oz (84 kg)  07/21/19 185 lb (83.9 kg)  06/28/19 185 lb (83.9 kg)    General: Appears his stated age, well developed, well nourished in NAD. Skin: Excoriations noted on bilateral upper extremities, chest and bilateral lower extremities. HEENT: Head: normal shape and size; Eyes: sclera white, no icterus, conjunctiva pink, PERRLA and EOMs intact;  Cardiovascular: Normal rate and rhythm.  Pulmonary/Chest: Normal effort and positive vesicular breath sounds. No respiratory distress.  Musculoskeletal:  No difficulty with gait.  Neurological: Alert and oriented.   BMET    Component Value Date/Time   NA 139 06/28/2019 0958   NA 140 08/25/2018 0000   K 4.5 06/28/2019 0958   CL 105 06/28/2019 0958   CO2 28 06/28/2019 0958   GLUCOSE 101 (H) 06/28/2019 0958   BUN 15 06/28/2019 0958   CREATININE 0.85 06/28/2019 0958   CALCIUM 9.6 06/28/2019 0958    Lipid Panel     Component Value Date/Time   CHOL 126 06/28/2019 0958   TRIG 61.0 06/28/2019 0958   HDL 40.80 06/28/2019 0958   CHOLHDL 3 06/28/2019 0958   VLDL 12.2 06/28/2019 0958   LDLCALC 73 06/28/2019 0958    CBC No results found for: WBC, RBC, HGB, HCT, PLT, MCV, MCH, MCHC, RDW, LYMPHSABS, MONOABS, EOSABS, BASOSABS  Hgb A1C No results found for: HGBA1C         Assessment & Plan:   Pruritus:  C met today Rx for Pred taper x9 days Continue Benadryl as needed Consider referral to dermatology if symptoms persist or worsen  We will follow-up after labs, return precautions discussed Webb Silversmith, NP This visit occurred during the SARS-CoV-2 public health emergency.  Safety protocols were in place, including screening questions prior to the visit, additional usage of staff PPE, and extensive cleaning of exam room while observing appropriate contact time as indicated for disinfecting solutions.

## 2020-04-07 ENCOUNTER — Encounter: Payer: Self-pay | Admitting: Internal Medicine

## 2020-04-20 ENCOUNTER — Encounter: Payer: Self-pay | Admitting: Podiatry

## 2020-04-20 ENCOUNTER — Ambulatory Visit (INDEPENDENT_AMBULATORY_CARE_PROVIDER_SITE_OTHER): Payer: Medicare HMO | Admitting: Podiatry

## 2020-04-20 ENCOUNTER — Other Ambulatory Visit: Payer: Self-pay

## 2020-04-20 ENCOUNTER — Ambulatory Visit (INDEPENDENT_AMBULATORY_CARE_PROVIDER_SITE_OTHER): Payer: Medicare HMO | Admitting: Internal Medicine

## 2020-04-20 ENCOUNTER — Encounter: Payer: Self-pay | Admitting: Internal Medicine

## 2020-04-20 ENCOUNTER — Ambulatory Visit: Payer: Medicare HMO | Admitting: Family Medicine

## 2020-04-20 VITALS — BP 144/72 | HR 82 | Temp 98.8°F | Wt 186.0 lb

## 2020-04-20 DIAGNOSIS — M2011 Hallux valgus (acquired), right foot: Secondary | ICD-10-CM | POA: Diagnosis not present

## 2020-04-20 DIAGNOSIS — L299 Pruritus, unspecified: Secondary | ICD-10-CM | POA: Diagnosis not present

## 2020-04-20 NOTE — Progress Notes (Signed)
Subjective:    Patient ID: Christian Gordon, male    DOB: [birth year], 75 y.o.   MRN: [id number]  HPI  Pt presents to the clinic today for follow up itching. This started 2 months ago. He reports there is no rash. He denies changes in soaps, lotions or detergents. He thought this may had been triggered by a new powdered ice tea he tried, but he stopped this and the itching persist. There are no new animals in the house. No one in the home has a similar issue. He has tried Benadryl and Hydrocortisone OTC with minimal relief of symptoms. He was seen for the same 3/17. Excoriation was noted but no rash. He was treated with a Pred Taper. He reports resolution of the itching while on Prednisone, but reports the itching returned as soon as he stopped the Prednisone. He still has not noticed a rash.  Review of Systems      Past Medical History:  Diagnosis Date  . Deaf, right   . Dizzinesses   . Hyperlipidemia   . Hypertension   . Prostate cancer ([place])   . SOB (shortness of breath)     Current Outpatient Medications  Medication Sig Dispense Refill  . amlodipine-atorvastatin (CADUET) 10-10 MG tablet Take 1 tablet by mouth daily. 90 tablet 3  . predniSONE (DELTASONE) 10 MG tablet Take 3 tabs on days 1-3, take 2 tabs on days 4-6, take 1 tab on days 7-9 18 tablet 0  . tamsulosin (FLOMAX) 0.4 MG CAPS capsule Take 2 capsules (0.8 mg total) by mouth daily. 180 capsule 3  . triamcinolone (KENALOG) 0.025 % cream Apply topically 3 (three) times daily. 30 g 2   No current facility-administered medications for this visit.    No Known Allergies  Family History  Problem Relation Age of Onset  . Heart disease Mother   . Heart disease Father   . Hypertension Father     Social History   Socioeconomic History  . Marital status: Married    Spouse name: [name] Serocki  . Number of children: 1  . Years of education: some college  . Highest education level: Not on file  Occupational History  .  Not on file  Tobacco Use  . Smoking status: Former Smoker    Packs/day: 1.00    Years: 60.00    Pack years: 60.00    Types: Cigarettes    Start date: 20    Quit date: 03/03/2019    Years since quitting: 1.1  . Smokeless tobacco: Never Used  Vaping Use  . Vaping Use: Never used  Substance and Sexual Activity  . Alcohol use: Yes    Alcohol/week: 1.0 standard drink    Types: 1 Cans of beer per week    Comment: one beer per month   . Drug use: Never  . Sexual activity: Not Currently    Partners: Female    Birth control/protection: None, Post-menopausal  Other Topics Concern  . Not on file  Social History Narrative   Left handed   Lives with wife, in a condo   From [place]      06/28/19   From: [place] moved to [place] Feb 2021   Living: with partner [name]   Work: retired Medical illustrator      Family: Son - [place], step grandchildren      Enjoys: watch baseball, casinos       Exercise: not currently   Diet: [place]  food, eating more fruit/veggies      Safety   Seat belts: Yes    Guns: Yes  and secure   Safe in relationships: Yes    Social Determinants of Health   Financial Resource Strain: Not on file  Food Insecurity: Not on file  Transportation Needs: Not on file  Physical Activity: Not on file  Stress: Not on file  Social Connections: Not on file  Intimate Partner Violence: Not on file     Constitutional: Denies fever, malaise, fatigue, headache or abrupt weight changes.  Respiratory: Denies difficulty breathing, shortness of breath, cough or sputum production.   Cardiovascular: Denies chest pain, chest tightness, palpitations or swelling in the hands or feet.  Skin: Pt reports itching. Denies redness, rashes, lesions or ulcercations.    No other specific complaints in a complete review of systems (except as listed in HPI above).  Objective:   Physical Exam  BP (!) 144/72   Pulse 82   Temp 98.8 F (37.1 C) (Temporal)   Wt  186 lb (84.4 kg)   SpO2 96%   BMI 25.23 kg/m   Wt Readings from Last 3 Encounters:  04/06/20 185 lb (83.9 kg)  10/06/19 185 lb 4 oz (84 kg)  07/21/19 185 lb (83.9 kg)    General: Appears his stated age, well developed, well nourished in NAD. Skin: Warm, dry and intact. Excoriation noted to BUE but no rash noted. Cardiovascular: Normal rate. Pulmonary/Chest: Normal effort and positive vesicular breath sounds. No respiratory distress. No wheezes, rales or ronchi noted.  Neurological: Alert and oriented.   BMET    Component Value Date/Time   NA 138 04/06/2020 1001   NA 140 08/25/2018 0000   K 4.1 04/06/2020 1001   CL 105 04/06/2020 1001   CO2 25 04/06/2020 1001   GLUCOSE 95 04/06/2020 1001   BUN 12 04/06/2020 1001   CREATININE 1.00 04/06/2020 1001   CALCIUM 9.4 04/06/2020 1001    Lipid Panel     Component Value Date/Time   CHOL 126 06/28/2019 0958   TRIG 61.0 06/28/2019 0958   HDL 40.80 06/28/2019 0958   CHOLHDL 3 06/28/2019 0958   VLDL 12.2 06/28/2019 0958   LDLCALC 73 06/28/2019 0958    CBC No results found for: WBC, RBC, HGB, HCT, PLT, MCV, MCH, MCHC, RDW, LYMPHSABS, MONOABS, EOSABS, BASOSABS  Hgb A1C No results found for: HGBA1C         Assessment & Plan:  Pruritis:  Start Zyrtec 10 mg PO daily at bedtime Start Famotidine 10 mg PO daily at bedtime Referral to dermatology placed for further evaluation  Return precautions discussed  Webb Silversmith, NP This visit occurred during the SARS-CoV-2 public health emergency.  Safety protocols were in place, including screening questions prior to the visit, additional usage of staff PPE, and extensive cleaning of exam room while observing appropriate contact time as indicated for disinfecting solutions.

## 2020-04-20 NOTE — Patient Instructions (Signed)
Pruritus Pruritus is an itchy feeling on the skin. One of the most common causes is dry skin, but many different things can cause itching. Most cases of itching do not require medical attention. Sometimes itchy skin can turn into a rash. Follow these instructions at home: Skin care  Apply moisturizing lotion to your skin as needed. Lotion that contains petroleum jelly is best.  Take medicines or apply medicated creams only as told by your health care provider. This may include: ? Corticosteroid cream. ? Anti-itch lotions. ? Oral antihistamines.  Apply a cool, wet cloth (cool compress) to the affected areas.  Take baths with one of the following: ? Epsom salts. You can get these at your local pharmacy or grocery store. Follow the instructions on the packaging. ? Baking soda. Pour a small amount into the bath as told by your health care provider. ? Colloidal oatmeal. You can get this at your local pharmacy or grocery store. Follow the instructions on the packaging.  Apply baking soda paste to your skin. To make the paste, stir water into a small amount of baking soda until it reaches a paste-like consistency.  Do not scratch your skin.  Do not take hot showers or baths, which can make itching worse. A cool shower may help with itching as long as you apply moisturizing lotion after the shower.  Do not use scented soaps, detergents, perfumes, and cosmetic products. Instead, use gentle, unscented versions of these items.   General instructions  Avoid wearing tight clothes.  Keep a journal to help find out what is causing your itching. Write down: ? What you eat and drink. ? What cosmetic products you use. ? What soaps or detergents you use. ? What you wear, including jewelry.  Use a humidifier. This keeps the air moist, which helps to prevent dry skin.  Be aware of any changes in your itchiness. Contact a health care provider if:  The itching does not go away after several  days.  You are unusually thirsty or urinating more than normal.  Your skin tingles or feels numb.  Your skin or the white parts of your eyes turn yellow (jaundice).  You feel weak.  You have any of the following: ? Night sweats. ? Tiredness (fatigue). ? Weight loss. ? Abdominal pain. Summary  Pruritus is an itchy feeling on the skin. One of the most common causes is dry skin, but many different conditions and factors can cause itching.  Apply moisturizing lotion to your skin as needed. Lotion that contains petroleum jelly is best.  Take medicines or apply medicated creams only as told by your health care provider.  Do not take hot showers or baths. Do not use scented soaps, detergents, perfumes, or cosmetic products. This information is not intended to replace advice given to you by your health care provider. Make sure you discuss any questions you have with your health care provider. Document Revised: 01/21/2017 Document Reviewed: 01/21/2017 Elsevier Patient Education  2021 [employer].

## 2020-04-20 NOTE — Progress Notes (Signed)
  Subjective:  Patient ID: Christian Gordon, male    DOB: Dec 05, 1945,  MRN: [id number]  Chief Complaint  Patient presents with  . Callouses    "I walk like a gimp because the callouses that he shaved is really sore now"   75 y.o. male returns for the above complaint.  Patient follows up with complaint of right hallux valgus/bunion deformity.  Patient states it painful to walk on it seems to get no more painful as he ambulates.  He is noted to have surgery however he wanted to discuss all the options available to him.  He denies any other acute complaints.  Objective:  There were no vitals filed for this visit. Podiatric Exam: Vascular: dorsalis pedis and posterior tibial pulses are palpable bilateral. Capillary return is immediate. Temperature gradient is WNL. Skin turgor WNL  Sensorium: Normal Semmes Weinstein monofilament test. Normal tactile sensation bilaterally. Nail Exam: Pt has thick disfigured discolored nails with subungual debris noted bilateral entire nail hallux through fifth toenails.  Pain on palpation to the nails. Ulcer Exam: There is no evidence of ulcer or pre-ulcerative changes or infection. Orthopedic Exam: Muscle tone and strength are WNL. No limitations in general ROM. No crepitus or effusions noted. HAV  B/L.  No intra-articular pain noted.  Good range of motion noted at first metatarsophalangeal joint.  Pain on palpation to the medial eminence. Hammer toes 2-5  B/L. Skin: No Porokeratosis. No infection or ulcers plantar fat.  Atrophy noted to bilateral heel with right greater than left side.  Patient has a cavovarus foot structure that seems to put excessive pressure in the forefoot on the plantar heel.    Assessment & Plan:   No diagnosis found.  Patient was evaluated and treated and all questions answered.  Right hallux valgus  severe -I explained the patient the etiology of the deformity and various treatment options were discussed.  Given the amount of pain  that he is having I encouraged him that he may benefit from a surgical intervention however for now we will like to think about the surgery.  He does have a severe bunion deformity.  If you like to proceed with that I will plan on obtaining x-ray during a clinic visit to discuss surgery.  Fat pad atrophy bilateral heel -I explained to the patient the etiology of atrophy and various treatment options were extensively discussed.  Given the foot structure that he had with pes cavus with excessive pressure to the heel on the ball the foot patient's fat pad is displacing leading to walking more on the bone therefore leading to pain.  Patient does not have any symptomatic pain due to plantar fasciitis. -Heel pads were dispensed to bilateral foot  Onychomycosis with pain  -Nails palliatively debrided as below. -Educated on self-care  Procedure: Nail Debridement Rationale: pain  Type of Debridement: manual, sharp debridement. Instrumentation: Nail nipper, rotary burr. Number of Nails: 10  Procedures and Treatment: Consent by patient was obtained for treatment procedures. The patient understood the discussion of treatment and procedures well. All questions were answered thoroughly reviewed. Debridement of mycotic and hypertrophic toenails, 1 through 5 bilateral and clearing of subungual debris. No ulceration, no infection noted.  Return Visit-Office Procedure: Patient instructed to return to the office for a follow up visit 3 months for continued evaluation and treatment.  Boneta Lucks, DPM    No follow-ups on file.

## 2020-05-01 ENCOUNTER — Ambulatory Visit (INDEPENDENT_AMBULATORY_CARE_PROVIDER_SITE_OTHER): Payer: Medicare HMO | Admitting: Family Medicine

## 2020-05-01 ENCOUNTER — Ambulatory Visit
Admission: RE | Admit: 2020-05-01 | Discharge: 2020-05-01 | Disposition: A | Discharge status: Home / Self Care | Payer: Medicare HMO | Source: Ambulatory Visit | Attending: Family Medicine | Admitting: Family Medicine

## 2020-05-01 ENCOUNTER — Other Ambulatory Visit: Payer: Self-pay

## 2020-05-01 VITALS — BP 130/72 | HR 82 | Temp 97.8°F | Ht 72.0 in | Wt 186.0 lb

## 2020-05-01 DIAGNOSIS — M7989 Other specified soft tissue disorders: Secondary | ICD-10-CM

## 2020-05-01 LAB — COMPREHENSIVE METABOLIC PANEL
ALT: 9 U/L (ref 0–53)
AST: 11 U/L (ref 0–37)
Albumin: 4 g/dL (ref 3.5–5.2)
Alkaline Phosphatase: 67 U/L (ref 39–117)
BUN: 12 mg/dL (ref 6–23)
CO2: 26 mEq/L (ref 19–32)
Calcium: 9.3 mg/dL (ref 8.4–10.5)
Chloride: 105 mEq/L (ref 96–112)
Creatinine, Ser: 1.01 mg/dL (ref 0.40–1.50)
GFR: 72.32 mL/min (ref >60.00)
Glucose, Bld: 89 mg/dL (ref 70–99)
Potassium: 4.1 mEq/L (ref 3.5–5.1)
Sodium: 138 mEq/L (ref 135–145)
Total Bilirubin: 0.8 mg/dL (ref 0.2–1.2)
Total Protein: 6.9 g/dL (ref 6.0–8.3)

## 2020-05-01 LAB — CBC WITH DIFFERENTIAL/PLATELET
Basophils Absolute: 0.1 10*3/uL (ref 0.0–0.1)
Basophils Relative: 0.8 % (ref 0.0–3.0)
Eosinophils Absolute: 0.4 10*3/uL (ref 0.0–0.7)
Eosinophils Relative: 5.6 % — ABNORMAL HIGH (ref 0.0–5.0)
HCT: 43.5 % (ref 39.0–52.0)
Hemoglobin: 14.6 g/dL (ref 13.0–17.0)
Lymphocytes Relative: 27.9 % (ref 12.0–46.0)
Lymphs Abs: 2.1 10*3/uL (ref 0.7–4.0)
MCHC: 33.4 g/dL (ref 30.0–36.0)
MCV: 88.4 fl (ref 78.0–100.0)
Monocytes Absolute: 0.5 10*3/uL (ref 0.1–1.0)
Monocytes Relative: 6.7 % (ref 3.0–12.0)
Neutro Abs: 4.4 10*3/uL (ref 1.4–7.7)
Neutrophils Relative %: 59 % (ref 43.0–77.0)
Platelets: 280 10*3/uL (ref 150.0–400.0)
RBC: 4.93 Mil/uL (ref 4.22–5.81)
RDW: 14.4 % (ref 11.5–15.5)
WBC: 7.5 10*3/uL (ref 4.0–10.5)

## 2020-05-01 NOTE — Progress Notes (Signed)
This visit occurred during the SARS-CoV-2 public health emergency.  Safety protocols were in place, including screening questions prior to the visit, additional usage of staff PPE, and extensive cleaning of exam room while observing appropriate contact time as indicated for disinfecting solutions.  Itching resolved with zyrtec and famotidine.  Still on meds currently.  Has derm f/u pending.    Recent swelling.  R leg only, no L sided sx.  H/o R foot bunion.  Gait changed from pain related to bunion. Leg swelling started about 10 days ago.  No h/o DVT.  No CP.  He has some baseline SOB, can walk a few blocks then need to rest.  That has been stable for the last 2-3 years.  H/o COPD.  Some occ wheeze, unrecalled inhaler use (possibly advair or similar).  Meds, vitals, and allergies reviewed.   ROS: Per HPI unless specifically indicated in ROS section   GEN: nad, alert and oriented HEENT: ncat NECK: supple w/o LA CV: rrr.  PULM: ctab except for very scant expiratory wheeze, no inc wob ABD: soft, +bs EXT: Right lower leg edema, see measurements below. SKIN: Well-perfused, no erythema. R calf 41 cm L calf 38 cm

## 2020-05-01 NOTE — Patient Instructions (Addendum)
Let us know the name of the inhaler.   Take care.  Glad to see you. Go to the lab on the way out.   If you have mychart we'll likely use that to update you.    Ultrasound today- see about that after labs.  They will call me with the report.  If negative then cut atorvastatin/amlodipine in half and update [place] in about 3 days, sooner if needed.

## 2020-05-03 DIAGNOSIS — M7989 Other specified soft tissue disorders: Secondary | ICD-10-CM | POA: Insufficient documentation

## 2020-05-03 NOTE — Assessment & Plan Note (Signed)
Unclear source.  Need to exclude DVT.  Rationale discussed with patient.  Check routine labs.  See notes on labs.  Sent for ultrasound, ultrasound was negative and patient is aware.  Since ultrasound was negative then cut atorvastatin/amlodipine in half and update [place] in about 3 days, sooner if needed.  Discussed with patient at office visit that amlodipine can theoretically cause asymmetric lower extremity edema.  Noted that this is not a new medication for the patient, however edema can sometimes happen after patient has been on the medication for a while.

## 2020-05-11 ENCOUNTER — Ambulatory Visit: Payer: Medicare HMO

## 2020-05-11 ENCOUNTER — Other Ambulatory Visit: Payer: Self-pay

## 2020-05-11 DIAGNOSIS — I10 Essential (primary) hypertension: Secondary | ICD-10-CM

## 2020-05-11 NOTE — Progress Notes (Signed)
I did not see any office notes or encounters showing this BP check was requested by a provider.  Patient presents today for a nurse visit blood pressure check for ongoing follow up and management.  Vital Sign Readings today BP- 140/66, P- 68.

## 2020-06-06 ENCOUNTER — Ambulatory Visit: Payer: Medicare HMO | Admitting: Family Medicine

## 2020-06-08 ENCOUNTER — Other Ambulatory Visit: Payer: Self-pay

## 2020-06-08 ENCOUNTER — Other Ambulatory Visit: Payer: Self-pay | Admitting: Family Medicine

## 2020-06-08 ENCOUNTER — Ambulatory Visit (INDEPENDENT_AMBULATORY_CARE_PROVIDER_SITE_OTHER): Payer: Medicare HMO | Admitting: Family Medicine

## 2020-06-08 ENCOUNTER — Encounter: Payer: Self-pay | Admitting: Family Medicine

## 2020-06-08 VITALS — BP 130/80 | HR 79 | Temp 98.1°F | Ht 68.0 in | Wt 183.8 lb

## 2020-06-08 DIAGNOSIS — Z0000 Encounter for general adult medical examination without abnormal findings: Secondary | ICD-10-CM

## 2020-06-08 DIAGNOSIS — I6522 Occlusion and stenosis of left carotid artery: Secondary | ICD-10-CM

## 2020-06-08 DIAGNOSIS — H547 Unspecified visual loss: Secondary | ICD-10-CM | POA: Diagnosis not present

## 2020-06-08 DIAGNOSIS — Z8546 Personal history of malignant neoplasm of prostate: Secondary | ICD-10-CM | POA: Diagnosis not present

## 2020-06-08 DIAGNOSIS — E782 Mixed hyperlipidemia: Secondary | ICD-10-CM

## 2020-06-08 DIAGNOSIS — Z1159 Encounter for screening for other viral diseases: Secondary | ICD-10-CM

## 2020-06-08 DIAGNOSIS — Z23 Encounter for immunization: Secondary | ICD-10-CM

## 2020-06-08 LAB — LIPID PANEL
Cholesterol: 118 mg/dL (ref 0–200)
HDL: 42 mg/dL (ref >39.00)
LDL Cholesterol: 61 mg/dL (ref 0–99)
NonHDL: 75.51
Total CHOL/HDL Ratio: 3
Triglycerides: 71 mg/dL (ref 0.0–149.0)
VLDL: 14.2 mg/dL (ref 0.0–40.0)

## 2020-06-08 LAB — PSA, MEDICARE: PSA: 0.08 ng/ml — ABNORMAL LOW (ref 0.10–4.00)

## 2020-06-08 NOTE — Patient Instructions (Addendum)
Check with your pharmacy about the Tetanus and Shingles Vaccine    We will plan to the carotid ultrasound in July

## 2020-06-08 NOTE — Progress Notes (Signed)
Needs f/u carotid artery imaging.

## 2020-06-08 NOTE — Progress Notes (Signed)
Subjective:   Everard Interrante is a 75 y.o. male who presents for Medicare Annual/Subsequent preventive examination.  Review of Systems    Review of Systems  Constitutional: Negative for chills and fever.  HENT: Negative for congestion and sore throat.   Eyes: Negative for blurred vision and double vision.  Respiratory: Negative for shortness of breath.   Cardiovascular: Negative for chest pain.  Gastrointestinal: Negative for heartburn, nausea and vomiting.  Genitourinary: Negative.   Musculoskeletal: Negative.  Negative for myalgias.  Skin: Negative for rash.  Neurological: Negative for dizziness and headaches.  Endo/Heme/Allergies: Does not bruise/bleed easily.  Psychiatric/Behavioral: Negative for depression. The patient is not nervous/anxious.     Cardiac Risk Factors include: advanced age (>54men, >49 women);dyslipidemia;male gender;smoking/ tobacco exposure;sedentary lifestyle     Objective:    Today's Vitals   06/08/20 0820  BP: 130/80  Pulse: 79  Temp: 98.1 F (36.7 C)  TempSrc: Temporal  SpO2: 95%  Weight: 183 lb 12 oz (83.3 kg)  Height: 5\' 8"  (1.727 m)   Body mass index is 27.94 kg/m.  Advanced Directives 06/08/2020  Does Patient Have a Medical Advance Directive? Yes  Does patient want to make changes to medical advance directive? Yes (MAU/Ambulatory/Procedural Areas - Information given)    Current Medications (verified) Outpatient Encounter Medications as of 06/08/2020  Medication Sig  . amlodipine-atorvastatin (CADUET) 10-10 MG tablet Take 1 tablet by mouth daily.  . cetirizine (ZYRTEC) 10 MG tablet Take 10 mg by mouth daily.  . famotidine (PEPCID) 10 MG tablet Take 10 mg by mouth as needed.  . tamsulosin (FLOMAX) 0.4 MG CAPS capsule Take 2 capsules (0.8 mg total) by mouth daily.  06/10/2020 triamcinolone (KENALOG) 0.025 % cream Apply topically 3 (three) times daily.   No facility-administered encounter medications on file as of 06/08/2020.    Allergies  (verified) Patient has no known allergies.   History: Past Medical History:  Diagnosis Date  . Deaf, right   . Dizzinesses   . Hyperlipidemia   . Hypertension   . Prostate cancer (HCC)   . SOB (shortness of breath)    Past Surgical History:  Procedure Laterality Date  . DENTAL SURGERY     Family History  Problem Relation Age of Onset  . Heart disease Mother   . Heart disease Father   . Hypertension Father    Social History   Socioeconomic History  . Marital status: Married    Spouse name: 06/10/2020 Serocki  . Number of children: 1  . Years of education: some college  . Highest education level: Not on file  Occupational History  . Not on file  Tobacco Use  . Smoking status: Former Smoker    Packs/day: 1.00    Years: 60.00    Pack years: 60.00    Types: Cigarettes    Start date: 35    Quit date: 03/03/2019    Years since quitting: 1.2  . Smokeless tobacco: Never Used  Vaping Use  . Vaping Use: Never used  Substance and Sexual Activity  . Alcohol use: Yes    Alcohol/week: 1.0 standard drink    Types: 1 Cans of beer per week    Comment: one beer per month   . Drug use: Never  . Sexual activity: Not Currently    Partners: Female    Birth control/protection: None, Post-menopausal  Other Topics Concern  . Not on file  Social History Narrative   Left handed   Lives with wife, in  a condo   From [place]      06/28/19   From: [place] moved to [facility] Feb 2021   Living: with partner [name]   Work: retired Medical illustrator      Family: Son - [place], step grandchildren      Enjoys: watch baseball, casinos       Exercise: not currently   Diet: [place] food, eating more fruit/veggies      Safety   Seat belts: Yes    Guns: Yes  and secure   Safe in relationships: Yes    Social Determinants of Radio broadcast assistant Strain: Not on file  Food Insecurity: Not on file  Transportation Needs: Not on file  Physical Activity: Not  on file  Stress: Not on file  Social Connections: Not on file    Tobacco Counseling Counseling given: Not Answered   Clinical Intake:  Pre-visit preparation completed: No  Pain : No/denies pain     BMI - recorded: 27.94 Nutritional Status: BMI 25 -29 Overweight Nutritional Risks: None Diabetes: No  How often do you need to have someone help you when you read instructions, pamphlets, or other written materials from your doctor or pharmacy?: 2 - Rarely What is the last grade level you completed in school?: high school  Diabetic? no  Interpreter Needed?: No      Activities of Daily Living In your present state of health, do you have any difficulty performing the following activities: 06/08/2020 06/28/2019  Hearing? Tempie Donning  Vision? Y N  Comment optometry referral -  Difficulty concentrating or making decisions? N N  Walking or climbing stairs? N N  Dressing or bathing? N N  Doing errands, shopping? N N  Preparing Food and eating ? N -  Using the Toilet? N -  In the past six months, have you accidently leaked urine? Y -  Do you have problems with loss of bowel control? N -  Managing your Medications? N -  Managing your Finances? N -  Housekeeping or managing your Housekeeping? N -  Some recent data might be hidden    Patient Care Team: [name], MD as PCP - General (Family Medicine) Felipa Furnace, DPM as Consulting Physician (Podiatry)  Indicate any recent Medical Services you may have received from other than [facility] providers in the past year (date may be approximate).     Assessment:   This is a routine wellness examination for Micheal.  Hearing/Vision screen  Hearing Screening   125Hz  250Hz  500Hz  1000Hz  2000Hz  3000Hz  4000Hz  6000Hz  8000Hz   Right ear:  0 0 0 0  0    Left ear:  20 20 20 20  20       Visual Acuity Screening   Right eye Left eye Both eyes  Without correction:     With correction: 20/50 20/40 20/30     Dietary issues and exercise  activities discussed: Current Exercise Habits: Home exercise routine, Type of exercise: walking, Time (Minutes): 10, Frequency (Times/Week): 7, Weekly Exercise (Minutes/Week): 70, Intensity: Mild, Exercise limited by: None identified  Goals Addressed            This Visit's Progress   . Patient Stated       Keep up with screenings      Depression Screen PHQ 2/9 Scores 06/08/2020 10/06/2019  PHQ - 2 Score 0 1  PHQ- 9 Score - 6    Fall Risk Fall Risk  06/08/2020 06/08/2020  10/06/2019  Falls in the past year? 0 0 0  Number falls in past yr: 0 0 0  Injury with Fall? 0 - -  Risk for fall due to : Impaired vision - -  Follow up Falls evaluation completed - -    FALL RISK PREVENTION PERTAINING TO THE HOME:  Any stairs in or around the home? Yes  If so, are there any without handrails? Yes  Home free of loose throw rugs in walkways, pet beds, electrical cords, etc? Yes  Adequate lighting in your home to reduce risk of falls? Yes   ASSISTIVE DEVICES UTILIZED TO PREVENT FALLS:  Life alert? No  Use of a cane, walker or w/c? No  Grab bars in the bathroom? No  Shower chair or bench in shower? Yes  Elevated toilet seat or a handicapped toilet? No    Cognitive Function:       Mini-Cog - 06/08/20 0841    Normal clock drawing test? yes    How many words correct? 3              Immunizations Immunization History  Administered Date(s) Administered  . PFIZER(Purple Top)SARS-COV-2 Vaccination 03/22/2019, 04/18/2019    TDAP status: Due, Education has been provided regarding the importance of this vaccine. Advised may receive this vaccine at local pharmacy or Health Dept. Aware to provide a copy of the vaccination record if obtained from local pharmacy or Health Dept. Verbalized acceptance and understanding.  Not in season for flu  Pneumococcal vaccine status: Completed during today's visit.  Covid-19 vaccine status: Completed vaccines  Qualifies for Shingles Vaccine? Yes    Zostavax completed No   Shingrix Completed?: No.    Education has been provided regarding the importance of this vaccine. Patient has been advised to call insurance company to determine out of pocket expense if they have not yet received this vaccine. Advised may also receive vaccine at local pharmacy or Health Dept. Verbalized acceptance and understanding.  Screening Tests Health Maintenance  Topic Date Due  . COVID-19 Vaccine (3 - Booster for Pfizer series) 09/18/2019  . TETANUS/TDAP  06/27/2020 (Originally 12/26/1964)  . Hepatitis C Screening  06/27/2020 (Originally 12/25/1963)  . PNA vac Low Risk Adult (1 of 2 - PCV13) 06/27/2020 (Originally 12/25/2008)  . INFLUENZA VACCINE  08/21/2020  . HPV VACCINES  Aged Out    Health Maintenance  Health Maintenance Due  Topic Date Due  . COVID-19 Vaccine (3 - Booster for Pfizer series) 09/18/2019    Colorectal cancer screening: No longer required.   Lung Cancer Screening: (Low Dose CT Chest recommended if Age 40-80 years, 30 pack-year currently smoking OR have quit w/in 15years.) does qualify.   Lung Cancer Screening Referral: already placed, not wanting to repeat this  Additional Screening:  Hepatitis C Screening: does qualify; Completed = pt declined  Vision Screening: Recommended annual ophthalmology exams for early detection of glaucoma and other disorders of the eye. Is the patient up to date with their annual eye exam?  No  Who is the provider or what is the name of the office in which the patient attends annual eye exams? Will place referral If pt is not established with a provider, would they like to be referred to a provider to establish care? Yes .   Dental Screening: Recommended annual dental exams for proper oral hygiene  Community Resource Referral / Chronic Care Management: CRR required this visit?  No   CCM required this visit?  No  Plan:     I have personally reviewed and noted the following in the patient's  chart:   . Medical and social history . Use of alcohol, tobacco or illicit drugs  . Current medications and supplements including opioid prescriptions. Patient is not currently taking opioid prescriptions. . Functional ability and status . Nutritional status . Physical activity . Advanced directives . List of other physicians . Hospitalizations, surgeries, and ER visits in previous 12 months . Vitals . Screenings to include cognitive, depression, and falls . Referrals and appointments  In addition, I have reviewed and discussed with patient certain preventive protocols, quality metrics, and best practice recommendations. A written personalized care plan for preventive services as well as general preventive health recommendations were provided to patient.     [name], MD   06/08/2020

## 2020-06-09 LAB — HEPATITIS C ANTIBODY
Hepatitis C Ab: NONREACTIVE
SIGNAL TO CUT-OFF: 0.01 (ref <1.00)

## 2020-06-21 ENCOUNTER — Other Ambulatory Visit: Payer: Self-pay | Admitting: Family Medicine

## 2020-06-21 DIAGNOSIS — L299 Pruritus, unspecified: Secondary | ICD-10-CM

## 2020-06-21 NOTE — Telephone Encounter (Signed)
Last filled on 10/06/19 # 30 g with 2 refills. LOV 06/08/20 CPE  No future appointments

## 2020-07-20 ENCOUNTER — Ambulatory Visit (INDEPENDENT_AMBULATORY_CARE_PROVIDER_SITE_OTHER): Payer: Medicare HMO | Admitting: Podiatry

## 2020-07-20 ENCOUNTER — Other Ambulatory Visit: Payer: Self-pay

## 2020-07-20 DIAGNOSIS — B351 Tinea unguium: Secondary | ICD-10-CM | POA: Diagnosis not present

## 2020-07-20 DIAGNOSIS — M79675 Pain in left toe(s): Secondary | ICD-10-CM | POA: Diagnosis not present

## 2020-07-20 DIAGNOSIS — M79674 Pain in right toe(s): Secondary | ICD-10-CM | POA: Diagnosis not present

## 2020-07-25 ENCOUNTER — Other Ambulatory Visit: Payer: Self-pay

## 2020-07-25 ENCOUNTER — Encounter: Payer: Self-pay | Admitting: Podiatry

## 2020-07-25 ENCOUNTER — Ambulatory Visit (INDEPENDENT_AMBULATORY_CARE_PROVIDER_SITE_OTHER): Payer: Medicare HMO

## 2020-07-25 DIAGNOSIS — I6522 Occlusion and stenosis of left carotid artery: Secondary | ICD-10-CM

## 2020-07-25 NOTE — Progress Notes (Signed)
  Subjective:  Patient ID: Christian Gordon, male    DOB: 1945/06/04,  MRN: [id number]  Chief Complaint  Patient presents with   Nail Problem    Routine foot care / Pt states his left foot is doing better since the last visit    75 y.o. male returns for the above complaint.  Patient presents with thickened elongated dystrophic toenails x10.  Patient would like to have them debrided down.  He is unable to do it himself.  He denies any other acute complaints.  Objective:  There were no vitals filed for this visit. Podiatric Exam: Vascular: dorsalis pedis and posterior tibial pulses are palpable bilateral. Capillary return is immediate. Temperature gradient is WNL. Skin turgor WNL  Sensorium: Normal Semmes Weinstein monofilament test. Normal tactile sensation bilaterally. Nail Exam: Pt has thick disfigured discolored nails with subungual debris noted bilateral entire nail hallux through fifth toenails.  Pain on palpation to the nails. Ulcer Exam: There is no evidence of ulcer or pre-ulcerative changes or infection. Orthopedic Exam: Muscle tone and strength are WNL. No limitations in general ROM. No crepitus or effusions noted. HAV  B/L.  No intra-articular pain noted.  Good range of motion noted at first metatarsophalangeal joint.  Pain on palpation to the medial eminence. Hammer toes 2-5  B/L. Skin: No Porokeratosis. No infection or ulcers plantar fat.  Atrophy noted to bilateral heel with right greater than left side.  Patient has a cavovarus foot structure that seems to put excessive pressure in the forefoot on the plantar heel.    Assessment & Plan:   1. Pain due to onychomycosis of toenails of both feet     Patient was evaluated and treated and all questions answered.  Right hallux valgus  severe -I explained the patient the etiology of the deformity and various treatment options were discussed.  Given the amount of pain that he is having I encouraged him that he may benefit from a  surgical intervention however for now we will like to think about the surgery.  He does have a severe bunion deformity.  If you like to proceed with that I will plan on obtaining x-ray during a clinic visit to discuss surgery.  Fat pad atrophy bilateral heel -I explained to the patient the etiology of atrophy and various treatment options were extensively discussed.  Given the foot structure that he had with pes cavus with excessive pressure to the heel on the ball the foot patient's fat pad is displacing leading to walking more on the bone therefore leading to pain.  Patient does not have any symptomatic pain due to plantar fasciitis. -Heel pads were dispensed to bilateral foot  Onychomycosis with pain  -Nails palliatively debrided as below. -Educated on self-care  Procedure: Nail Debridement Rationale: pain  Type of Debridement: manual, sharp debridement. Instrumentation: Nail nipper, rotary burr. Number of Nails: 10  Procedures and Treatment: Consent by patient was obtained for treatment procedures. The patient understood the discussion of treatment and procedures well. All questions were answered thoroughly reviewed. Debridement of mycotic and hypertrophic toenails, 1 through 5 bilateral and clearing of subungual debris. No ulceration, no infection noted.  Return Visit-Office Procedure: Patient instructed to return to the office for a follow up visit 3 months for continued evaluation and treatment.  Boneta Lucks, DPM    No follow-ups on file.

## 2020-07-31 ENCOUNTER — Telehealth: Payer: Self-pay | Admitting: *Deleted

## 2020-07-31 NOTE — Telephone Encounter (Signed)
Spoke to patient via telephone re: scheduling his annual low dose lung screening ct scan. Patient would like to wait until September or October to schedule this.

## 2020-09-15 ENCOUNTER — Other Ambulatory Visit: Payer: Self-pay | Admitting: Family Medicine

## 2020-09-15 DIAGNOSIS — I10 Essential (primary) hypertension: Secondary | ICD-10-CM

## 2020-09-15 DIAGNOSIS — E782 Mixed hyperlipidemia: Secondary | ICD-10-CM

## 2020-10-19 ENCOUNTER — Ambulatory Visit: Payer: Medicare HMO | Admitting: Podiatry

## 2020-11-02 ENCOUNTER — Ambulatory Visit (INDEPENDENT_AMBULATORY_CARE_PROVIDER_SITE_OTHER): Payer: Medicare HMO | Admitting: Podiatry

## 2020-11-02 ENCOUNTER — Encounter (INDEPENDENT_AMBULATORY_CARE_PROVIDER_SITE_OTHER): Payer: Self-pay

## 2020-11-02 ENCOUNTER — Other Ambulatory Visit: Payer: Self-pay

## 2020-11-02 DIAGNOSIS — M79674 Pain in right toe(s): Secondary | ICD-10-CM

## 2020-11-02 DIAGNOSIS — B353 Tinea pedis: Secondary | ICD-10-CM | POA: Diagnosis not present

## 2020-11-02 DIAGNOSIS — M79675 Pain in left toe(s): Secondary | ICD-10-CM | POA: Diagnosis not present

## 2020-11-02 DIAGNOSIS — B351 Tinea unguium: Secondary | ICD-10-CM

## 2020-11-02 MED ORDER — CLOTRIMAZOLE-BETAMETHASONE 1-0.05 % EX CREA: 1.0000 "application " | TOPICAL_CREAM | Freq: Two times a day (BID) | CUTANEOUS | 0 refills | Status: DC

## 2020-11-07 ENCOUNTER — Encounter: Payer: Self-pay | Admitting: Podiatry

## 2020-11-07 ENCOUNTER — Telehealth: Payer: Self-pay | Admitting: Family Medicine

## 2020-11-07 DIAGNOSIS — Z87891 Personal history of nicotine dependence: Secondary | ICD-10-CM

## 2020-11-07 NOTE — Telephone Encounter (Signed)
Please call pt back and try to get more details.   June 2021 he had a CT scan for lung cancer screening - but during our annual he said he didn't want to repeat this. If he has changed his mind he should be able to call the lung cancer screening team.

## 2020-11-07 NOTE — Telephone Encounter (Signed)
Sending to Dr Einar Pheasant for review. I do not see any notes in regards to this or referrals.

## 2020-11-07 NOTE — Progress Notes (Signed)
  Subjective:  Patient ID: Christian Gordon, male    DOB: 17-Nov-1945,  MRN: [id number]  Chief Complaint  Patient presents with   Nail Problem    Nail trim    75 y.o. male returns for the above complaint.  Patient presents with thickened elongated dystrophic toenails x10.  Patient would like to have them debrided down.  He is unable to do it himself.  He denies any other acute complaints.  Patient also has secondary complaint of athlete's foot with subjective component itching.  Patient is tried over-the-counter medication which has helped.  He would like to discuss other treatment options.  He denies any other acute complaints.  Objective:  There were no vitals filed for this visit. Podiatric Exam: Vascular: dorsalis pedis and posterior tibial pulses are palpable bilateral. Capillary return is immediate. Temperature gradient is WNL. Skin turgor WNL  Sensorium: Normal Semmes Weinstein monofilament test. Normal tactile sensation bilaterally. Nail Exam: Pt has thick disfigured discolored nails with subungual debris noted bilateral entire nail hallux through fifth toenails.  Pain on palpation to the nails. Ulcer Exam: There is no evidence of ulcer or pre-ulcerative changes or infection. Orthopedic Exam: Muscle tone and strength are WNL. No limitations in general ROM. No crepitus or effusions noted. HAV  B/L.  No intra-articular pain noted.  Good range of motion noted at first metatarsophalangeal joint.  Pain on palpation to the medial eminence. Hammer toes 2-5  B/L. Skin: No Porokeratosis. No infection or ulcers plantar fat.  Athlete's foot noted to bilateral plantar foot with epidermal lysis and subjective component of itching.    Assessment & Plan:   1. Tinea pedis of both feet   2. Pain due to onychomycosis of toenails of both feet      Patient was evaluated and treated and all questions answered.  Right hallux valgus  severe -I explained the patient the etiology of the deformity and  various treatment options were discussed.  Given the amount of pain that he is having I encouraged him that he may benefit from a surgical intervention however for now we will like to think about the surgery.  He does have a severe bunion deformity.  If you like to proceed with that I will plan on obtaining x-ray during a clinic visit to discuss surgery.  Bilateral athlete's foot -I explained to the patient the etiology of athlete's foot emergency room and options were extensively discussed.  Given the patient has failed all over-the-counter medication I believe patient will benefit from Lotrisone cream.  Advised him to apply twice a day to the affected area.  Patient states understand will do so immediately.  Onychomycosis with pain  -Nails palliatively debrided as below. -Educated on self-care  Procedure: Nail Debridement Rationale: pain  Type of Debridement: manual, sharp debridement. Instrumentation: Nail nipper, rotary burr. Number of Nails: 10  Procedures and Treatment: Consent by patient was obtained for treatment procedures. The patient understood the discussion of treatment and procedures well. All questions were answered thoroughly reviewed. Debridement of mycotic and hypertrophic toenails, 1 through 5 bilateral and clearing of subungual debris. No ulceration, no infection noted.  Return Visit-Office Procedure: Patient instructed to return to the office for a follow up visit 3 months for continued evaluation and treatment.  Boneta Lucks, DPM    No follow-ups on file.

## 2020-11-07 NOTE — Telephone Encounter (Signed)
Pt called in stating that he had a referral for a intense chest xray and pt said to call back in sept or oct and pt has not heard anything. Pt said to call him because he did not know if he should call the referral people

## 2020-11-08 NOTE — Addendum Note (Signed)
Addended by: Waunita Schooner R on: 11/08/2020 12:57 PM   Modules accepted: Orders

## 2020-11-08 NOTE — Telephone Encounter (Signed)
I spoke pt and he said someone (does not know who ) called him in July or Aug of this year to schedule another CT but pt was going out of town and asked that they call pt back in Oct or Nov and pt has not heard from anyone and I notified pt as instructed of Dr Verda Cumins note. Pt said he does not remember saying he did not want to repeat the CT. Pt said that he does want to schedule a repeat CT but does not know who to contact and request that Dr Cody's office schedule pt an appt and call pt with info. Sending note to Dr Einar Pheasant and [facility] CMA.

## 2020-11-08 NOTE — Telephone Encounter (Signed)
Placed new referral to the lung cancer screening group. He should receive a phone call.   Will route to MA to f/u with patient  Will route to referral pool as FYI in case future communication needed

## 2020-11-16 NOTE — Telephone Encounter (Signed)
Sent message to Lung cancer screening group to follow up. Referral was closed with no additional notation besides that it was in the workque. Waiting on the response

## 2020-11-21 ENCOUNTER — Telehealth: Payer: Self-pay | Admitting: Family Medicine

## 2020-11-21 DIAGNOSIS — J449 Chronic obstructive pulmonary disease, unspecified: Secondary | ICD-10-CM

## 2020-11-21 MED ORDER — FLUTICASONE-SALMETEROL 100-50 MCG/ACT IN AEPB: 1.0000 | INHALATION_SPRAY | Freq: Two times a day (BID) | RESPIRATORY_TRACT | 3 refills | Status: DC

## 2020-11-21 NOTE — Telephone Encounter (Signed)
Sent to pharmacy  If breathing not improved, recommend f/u visit in 2 weeks with any provider or sooner if worsening  Please have him schedule 4 month f/u with me

## 2020-11-21 NOTE — Telephone Encounter (Signed)
Pt want a refill on the wixela inhaler 100/50 that another provider [place] prescribed to him. Pt wants to be called

## 2020-12-12 ENCOUNTER — Other Ambulatory Visit: Payer: Self-pay | Admitting: *Deleted

## 2020-12-12 DIAGNOSIS — Z87891 Personal history of nicotine dependence: Secondary | ICD-10-CM

## 2020-12-19 NOTE — Telephone Encounter (Signed)
I spoke with pts wife (DPR signed) pt was not at home. Mrs Christian Gordon was notified as instructed and pts wife voiced understanding and she will let pt know when he returns home this afternoon and she will have pt cb about appts.

## 2020-12-20 ENCOUNTER — Other Ambulatory Visit: Payer: Self-pay

## 2020-12-20 ENCOUNTER — Ambulatory Visit
Admission: RE | Admit: 2020-12-20 | Discharge: 2020-12-20 | Disposition: A | Discharge status: Home / Self Care | Payer: Medicare HMO | Source: Ambulatory Visit | Attending: Acute Care | Admitting: Acute Care

## 2020-12-20 DIAGNOSIS — Z87891 Personal history of nicotine dependence: Secondary | ICD-10-CM | POA: Insufficient documentation

## 2020-12-28 ENCOUNTER — Other Ambulatory Visit: Payer: Self-pay | Admitting: Acute Care

## 2020-12-28 DIAGNOSIS — F17210 Nicotine dependence, cigarettes, uncomplicated: Secondary | ICD-10-CM

## 2020-12-28 DIAGNOSIS — Z87891 Personal history of nicotine dependence: Secondary | ICD-10-CM

## 2021-01-01 ENCOUNTER — Telehealth: Payer: Self-pay | Admitting: Family Medicine

## 2021-01-01 NOTE — Telephone Encounter (Signed)
Pt called in stating that he never received results back from the imaging center after he went to his appointment

## 2021-01-01 NOTE — Telephone Encounter (Signed)
Results called to patient.  Patient verbilized understanding of results/recommendations and has no further questions.  Results had been mailed to patient this morning.

## 2021-02-08 ENCOUNTER — Other Ambulatory Visit: Payer: Self-pay

## 2021-02-08 ENCOUNTER — Encounter: Payer: Self-pay | Admitting: Podiatry

## 2021-02-08 ENCOUNTER — Ambulatory Visit (INDEPENDENT_AMBULATORY_CARE_PROVIDER_SITE_OTHER): Payer: Medicare HMO | Admitting: Podiatry

## 2021-02-08 DIAGNOSIS — M79674 Pain in right toe(s): Secondary | ICD-10-CM

## 2021-02-08 DIAGNOSIS — B351 Tinea unguium: Secondary | ICD-10-CM | POA: Diagnosis not present

## 2021-02-08 DIAGNOSIS — M79675 Pain in left toe(s): Secondary | ICD-10-CM | POA: Diagnosis not present

## 2021-02-08 DIAGNOSIS — M2011 Hallux valgus (acquired), right foot: Secondary | ICD-10-CM | POA: Diagnosis not present

## 2021-02-08 NOTE — Progress Notes (Signed)
This patient returns to the office for evaluation and treatment of long thick painful nails .  This patient is unable to trim his own nails since the patient cannot reach his feet.  Patient says the nails are painful walking and wearing his shoes.  He returns for preventive foot care services.  General Appearance  Alert, conversant and in no acute stress.  Vascular  Dorsalis pedis and posterior tibial  pulses are palpable  bilaterally.  Capillary return is within normal limits  bilaterally. Temperature is within normal limits  bilaterally.  Neurologic  Senn-Weinstein monofilament wire test within normal limits  bilaterally. Muscle power within normal limits bilaterally.  Nails Thick disfigured discolored nails with subungual debris  from hallux to fifth toes bilaterally. No evidence of bacterial infection or drainage bilaterally.  Orthopedic  No limitations of motion  feet .  No crepitus or effusions noted.  No bony pathology or digital deformities noted.  HAV right foot.  Hammer toes  B/L.  Skin  normotropic skin with no porokeratosis noted bilaterally.  No signs of infections or ulcers noted.     Onychomycosis  Pain in toes right foot  Pain in toes left foot  Debridement  of nails  1-5  B/L with a nail nipper.  Nails were then filed using a dremel tool with no incidents.    RTC  3 months   Gardiner Barefoot DPM

## 2021-02-15 ENCOUNTER — Ambulatory Visit: Payer: Medicare HMO | Admitting: Podiatry

## 2021-03-02 ENCOUNTER — Other Ambulatory Visit: Payer: Self-pay | Admitting: Family Medicine

## 2021-03-02 DIAGNOSIS — Z8546 Personal history of malignant neoplasm of prostate: Secondary | ICD-10-CM

## 2021-05-10 ENCOUNTER — Ambulatory Visit (INDEPENDENT_AMBULATORY_CARE_PROVIDER_SITE_OTHER): Payer: Medicare HMO | Admitting: Podiatry

## 2021-05-10 ENCOUNTER — Encounter: Payer: Self-pay | Admitting: Podiatry

## 2021-05-10 DIAGNOSIS — M79675 Pain in left toe(s): Secondary | ICD-10-CM

## 2021-05-10 DIAGNOSIS — M2011 Hallux valgus (acquired), right foot: Secondary | ICD-10-CM | POA: Diagnosis not present

## 2021-05-10 DIAGNOSIS — M79674 Pain in right toe(s): Secondary | ICD-10-CM | POA: Diagnosis not present

## 2021-05-10 DIAGNOSIS — B351 Tinea unguium: Secondary | ICD-10-CM

## 2021-05-10 NOTE — Progress Notes (Signed)
This patient returns to the office for evaluation and treatment of long thick painful nails .  This patient is unable to trim his own nails since the patient cannot reach his feet.  Patient says the nails are painful walking and wearing his shoes.  He returns for preventive foot care services. ? ?General Appearance  Alert, conversant and in no acute stress. ? ?Vascular  Dorsalis pedis and posterior tibial  pulses are palpable  bilaterally.  Capillary return is within normal limits  bilaterally. Temperature is within normal limits  bilaterally. ? ?Neurologic  Senn-Weinstein monofilament wire test within normal limits  bilaterally. Muscle power within normal limits bilaterally. ? ?Nails Thick disfigured discolored nails with subungual debris  from second to fifth toes bilaterally. No evidence of bacterial infection or drainage bilaterally. ? ?Orthopedic  No limitations of motion  feet .  No crepitus or effusions noted.  No bony pathology or digital deformities noted.  HAV right foot.  Hammer toes  B/L. ? ?Skin  normotropic skin with no porokeratosis noted bilaterally.  No signs of infections or ulcers noted.    ? ?Onychomycosis  Pain in toes right foot  Pain in toes left foot ? ?Debridement  of nails  2-5  B/L with a nail nipper.  Nails were then filed using a dremel tool with no incidents.    RTC  3 months ? ? ?Gardiner Barefoot DPM   ?

## 2021-05-28 ENCOUNTER — Encounter: Payer: Self-pay | Admitting: Family Medicine

## 2021-05-28 ENCOUNTER — Ambulatory Visit (INDEPENDENT_AMBULATORY_CARE_PROVIDER_SITE_OTHER): Payer: Medicare HMO | Admitting: Family Medicine

## 2021-05-28 VITALS — BP 130/60 | HR 84 | Temp 98.0°F | Ht 68.0 in | Wt 175.4 lb

## 2021-05-28 DIAGNOSIS — M24811 Other specific joint derangements of right shoulder, not elsewhere classified: Secondary | ICD-10-CM

## 2021-05-28 DIAGNOSIS — M25511 Pain in right shoulder: Secondary | ICD-10-CM | POA: Diagnosis not present

## 2021-05-28 DIAGNOSIS — R29898 Other symptoms and signs involving the musculoskeletal system: Secondary | ICD-10-CM | POA: Diagnosis not present

## 2021-05-28 DIAGNOSIS — G8929 Other chronic pain: Secondary | ICD-10-CM

## 2021-05-28 DIAGNOSIS — M549 Dorsalgia, unspecified: Secondary | ICD-10-CM | POA: Diagnosis not present

## 2021-05-28 DIAGNOSIS — G8911 Acute pain due to trauma: Secondary | ICD-10-CM

## 2021-05-28 MED ORDER — TRAMADOL HCL 50 MG PO TABS: 50.0000 mg | ORAL_TABLET | Freq: Three times a day (TID) | ORAL | 0 refills | Status: DC | PRN

## 2021-05-28 MED ORDER — PREDNISONE 20 MG PO TABS: ORAL_TABLET | ORAL | 0 refills | Status: DC

## 2021-05-28 NOTE — Progress Notes (Signed)
? ? ?Christian Gordon T. Christian Toves, MD, [facility] ?Therapist, music at [facility] ?[place] ?[place], [address] ? ?Phone: [phone]  FAX: [phone] ? ?Christian Gordon - 76 y.o. male  MRN [id number]  Date of Birth: [birth year] ? ?Date: 05/28/2021  PCP: [name], MD  Referral: [name], MD ? ?Chief Complaint  ?Patient presents with  ? Arm Pain  ?  Right  ? Back Pain  ?  Low  ? ? ?This visit occurred during the SARS-CoV-2 public health emergency.  Safety protocols were in place, including screening questions prior to the visit, additional usage of staff PPE, and extensive cleaning of exam room while observing appropriate contact time as indicated for disinfecting solutions.  ? ?Subjective:  ? ?Christian Gordon is a 76 y.o. very pleasant male patient with Body mass index is 26.67 kg/m?. who presents with the following: ? ?Pleasant gentleman and roughly 1 months ago he was lifting a 35 pound bag and threw his right arm up and then sustained an acute injury.  At that point he had immediate pain and since then he has had extreme difficulty abducting the shoulder.  He has pain essentially all of the time.  He has a deep dull ache in the shoulder and in and about the upper arm and some into the shoulder blade.  He denies any radicular pain denies any neck pain.  No prior operative intervention, major fracture, or dislocation in the affected shoulder. ? ?He also has some ongoing intermittent back pain in the lumbar spine without any form of radiculopathy.  No prior operations, major injury, and he does have a deep dull ache localized to the low back without any radiation to any other part of the back or buttocks or legs.  No numbness or tingling. ? ?Review of Systems is noted in the HPI, as appropriate  ? ?Objective:  ? ?BP 130/60   Pulse 84   Temp 98 ?F (36.7 ?C) (Oral)   Ht '5\' 8"'$  (1.727 m)   Wt 175 lb 6 oz (79.5 kg)   SpO2 94%   BMI 26.67 kg/m?  ? ? ?Shoulder:  Right ?Ecchymosis/edema: neg  ?AC joint, scapula, clavicle: NT ?Cervical spine: NT, full ROM ?Spurling's: neg ?Abduction: He is able to actively abduct the shoulder to 45 degrees, but passively I am able to get roughly 165 degrees.  Strength is 3 -/5 ?Flexion: Active flexion to 120 degrees and full flexion is achieved passively ?Strength is 3+/5  ?Internal and external range of motion was checked at 90 degrees of abduction passively, and range of motion is full ?IR, 5/5 ?ER, 4/5 ?AC crossover and compression: neg ?Neer: neg ?Hawkins: neg ?Drop Test: pos ?Empty Can: neg ?Supraspinatus insertion: NT ?Bicipital groove: NT ?Speed's: neg ?Yergason's: neg ?Sulcus sign: neg ?Scapular dyskinesis: none ?C5-T1 intact ?Sensation intact ?Grip 5/5  ? ?Lumbar spine has some relatively diffuse tenderness from L2-L5.  Predominant pain in the paraspinous region without spinous process pain.  No pain in the pelvic region, with pelvic manipulation, GTB with no significant tenderness. ?Grossly full range of motion at the hip without significant terminal endpoint pain at abduction, or internal and external rotation. ? ?Radiology: ?I independently reviewed the patient's bone windows on the CT of his chest, without contrast from 12/2020.  [facility] joint is visualized well on the coronal views.  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  ?Electronically Signed  By: Owens Loffler, MD On: 05/28/2021 11:00 AM  EDT  ? ?Assessment and Plan:  ? ?  ICD-10-CM   ?1. Internal derangement of right shoulder  M24.811 Ambulatory referral to Physical Therapy  ?  ?2. Acute pain of right shoulder due to trauma  M25.511 Ambulatory referral to Physical Therapy  ? G89.11   ?  ?3. Shoulder weakness  R29.898 Ambulatory referral to Physical Therapy  ?  ?4. Chronic back pain greater than 3 months duration  M54.9 Ambulatory referral to Physical Therapy  ? G89.29   ?  ? ?Very high likelihood of rotator cuff tear, full-thickness versus high-grade partial-thickness of  the right, likely supraspinatus with at least partial infraspinatus tear.  With age, medical comorbidities, there is a high likelihood that he will be able to manage this nonoperatively successfully.  We are going to send him to formal physical therapy, and I am going to have him start some basic home rehab today.  Moon protocol.  He understands that there could be a permanent decrease of strength in the plane of abduction, but it is impossible to know at this point.  We will reserve additional imaging depending on patient's clinical outcome. ? ?Chronic back pain ongoing and intermittent for years.  Would anticipate intermittent, indefinite pain with flareups and periods of relative pain-free. ? ?He did ask for some pain medicine and had a question about chronic pain medication.  I am going to give him some tramadol for now, but in terms of chronic pain management, I did let him know that primary care would need to be involved on a long-term basis if this is considered. ? ?I hope a burst of some steroids would acutely calm down his back and may calm down some acute pain with the shoulder as well.  He still does poorly at follow-up then I will likely inject his shoulder joint. ? ?Medication Management during today's office visit: ?Meds ordered this encounter  ?Medications  ? traMADol (ULTRAM) 50 MG tablet  ?  Sig: Take 1 tablet (50 mg total) by mouth every 8 (eight) hours as needed for moderate pain.  ?  Dispense:  20 tablet  ?  Refill:  0  ? predniSONE (DELTASONE) 20 MG tablet  ?  Sig: 2 tabs po daily for 5 days, then 1 tab po daily for 5 days  ?  Dispense:  15 tablet  ?  Refill:  0  ? ?Medications Discontinued During This Encounter  ?Medication Reason  ? cetirizine (ZYRTEC) 10 MG tablet Completed Course  ? clotrimazole-betamethasone (LOTRISONE) cream Completed Course  ? famotidine (PEPCID) 10 MG tablet Completed Course  ? triamcinolone (KENALOG) 0.025 % cream Completed Course  ? ? ?Orders placed today for  conditions managed today: ?Orders Placed This Encounter  ?Procedures  ? Ambulatory referral to Physical Therapy  ? ? ?Follow-up if needed: Return in about 6 weeks (around 07/09/2021) for Dr. Lorelei Pont shoulder follow-up. ? ?[employer] One speech-to-text software was used for transcription in this dictation.  Possible transcriptional errors can occur using Editor, commissioning.  ? ?Signed, ? ?Ariyana Faw T. Nicosha Struve, MD ? ? ?Outpatient Encounter Medications as of 05/28/2021  ?Medication Sig  ? amlodipine-atorvastatin (CADUET) 10-10 MG tablet TAKE 1 TABLET BY MOUTH EVERY DAY  ? fluticasone-salmeterol (WIXELA INHUB) 100-50 MCG/ACT AEPB Inhale 1 puff into the lungs 2 (two) times daily.  ? predniSONE (DELTASONE) 20 MG tablet 2 tabs po daily for 5 days, then 1 tab po daily for 5 days  ? tamsulosin (FLOMAX) 0.4 MG CAPS capsule TAKE 2 CAPSULES BY  MOUTH EVERY DAY  ? traMADol (ULTRAM) 50 MG tablet Take 1 tablet (50 mg total) by mouth every 8 (eight) hours as needed for moderate pain.  ? [DISCONTINUED] cetirizine (ZYRTEC) 10 MG tablet Take 10 mg by mouth daily.  ? [DISCONTINUED] clotrimazole-betamethasone (LOTRISONE) cream Apply 1 application topically 2 (two) times daily.  ? [DISCONTINUED] famotidine (PEPCID) 10 MG tablet Take 10 mg by mouth as needed.  ? [DISCONTINUED] triamcinolone (KENALOG) 0.025 % cream APPLY TO AFFECTED AREA 3 TIMES A DAY  ? ?No facility-administered encounter medications on file as of 05/28/2021.  ?  ?

## 2021-06-04 ENCOUNTER — Other Ambulatory Visit: Payer: Self-pay

## 2021-06-04 ENCOUNTER — Encounter: Payer: Self-pay | Admitting: Physical Therapy

## 2021-06-04 ENCOUNTER — Ambulatory Visit: Payer: Medicare HMO | Attending: Family Medicine | Admitting: Physical Therapy

## 2021-06-04 DIAGNOSIS — M75101 Unspecified rotator cuff tear or rupture of right shoulder, not specified as traumatic: Secondary | ICD-10-CM

## 2021-06-04 DIAGNOSIS — R29898 Other symptoms and signs involving the musculoskeletal system: Secondary | ICD-10-CM | POA: Diagnosis not present

## 2021-06-04 DIAGNOSIS — M6281 Muscle weakness (generalized): Secondary | ICD-10-CM

## 2021-06-04 DIAGNOSIS — M549 Dorsalgia, unspecified: Secondary | ICD-10-CM | POA: Diagnosis not present

## 2021-06-04 DIAGNOSIS — G8929 Other chronic pain: Secondary | ICD-10-CM | POA: Insufficient documentation

## 2021-06-04 DIAGNOSIS — M25511 Pain in right shoulder: Secondary | ICD-10-CM

## 2021-06-04 DIAGNOSIS — M24811 Other specific joint derangements of right shoulder, not elsewhere classified: Secondary | ICD-10-CM | POA: Diagnosis not present

## 2021-06-04 DIAGNOSIS — G8911 Acute pain due to trauma: Secondary | ICD-10-CM | POA: Diagnosis not present

## 2021-06-04 NOTE — Therapy (Addendum)
?OUTPATIENT PHYSICAL THERAPY SHOULDER EVALUATION ? ? ?Patient Name: Amine Adelson ?MRN: [id number] ?DOB:12/23/1945, 76 y.o., male ?Today's Date: 06/05/2021 ? ? PT End of Session - 06/05/21 0903   ? ? Visit Number 1   ? Number of Visits 16   ? Date for PT Re-Evaluation 07/30/21   ? PT Start Time 1015   ? PT Stop Time 1100   ? PT Time Calculation (min) 45 min   ? Activity Tolerance Patient limited by pain   ? Behavior During Therapy [facility] for tasks assessed/performed   ? ?  ?  ? ?  ? ? ?Past Medical History:  ?Diagnosis Date  ? Deaf, right   ? Dizzinesses   ? Hyperlipidemia   ? Hypertension   ? Prostate cancer ([place])   ? SOB (shortness of breath)   ? ?Past Surgical History:  ?Procedure Laterality Date  ? DENTAL SURGERY    ? ?Patient Active Problem List  ? Diagnosis Date Noted  ? Right leg swelling 05/03/2020  ? 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 Bunion of right foot 10/06/2019  ? Pruritus 10/06/2019  ? Liver lesion 07/22/2019  ? Atherosclerosis 07/22/2019  ? Carotid artery stenosis 06/28/2019  ? History of prostate cancer 06/28/2019  ? Hyperlipidemia 06/28/2019  ? Hypertension 06/28/2019  ? Deafness 06/28/2019  ? COPD (chronic obstructive pulmonary disease) ([place]) 06/28/2019  ? History of tobacco use 06/28/2019  ? ? ?PCP: Dr. Einar Pheasant  ? ?REFERRING PROVIDER: Dr. Lorelei Pont  ? ?REFERRING DIAG: Acute Right Shoulder Pain  ? ?THERAPY DIAG:  ?Acute pain of right shoulder ? ?Muscle weakness (generalized) ? ?Nontraumatic tear of right rotator cuff, unspecified tear extent ? ? ?ONSET DATE: 03/21/21 ? ?SUBJECTIVE:                                                                                                                                                                                     ? ?SUBJECTIVE STATEMENT: ?Pt reports that he started having right shoulder pain 5 weeks ago. He was walking out to put garbage bag into trash with his right hand and after hoisting bag into trash can he instantly felt pain and he  could no longer raise his right shoulder about his head.  ? ?PERTINENT HISTORY: ?Per Dr. Lillie Fragmin Note on  05/28/21 ? ?Abdulmalik Darco is a 76 y.o. very pleasant male patient with Body mass index is 26.67 kg/m?. who presents with the following: ?  ?Pleasant gentleman and roughly 1 months ago he was lifting a 35 pound bag and threw his right arm up and then sustained an acute injury.  At that point he had immediate pain and since then  he has had extreme difficulty abducting the shoulder.  He has pain essentially all of the time.  He has a deep dull ache in the shoulder and in and about the upper arm and some into the shoulder blade.  He denies any radicular pain denies any neck pain.  No prior operative intervention, major fracture, or dislocation in the affected shoulder. ? ?He also has some ongoing intermittent back pain in the lumbar spine without any form of radiculopathy.  No prior operations, major injury, and he does have a deep dull ache localized to the low back without any radiation to any other part of the back or buttocks or legs.  No numbness or tingling. ? ?PAIN:  ?Are you having pain? Yes: NPRS scale: 6/10 ?Pain location: Right forearm and right biceps  ?Pain description: Shooting and achy  ?Aggravating factors: Pain with right abducting shoulder  ?Relieving factors: Tramadol and prednisone have helped with arm pain  ? ?PRECAUTIONS: None ? ?WEIGHT BEARING RESTRICTIONS No ? ?FALLS:  ?Has patient fallen in last 6 months? No ? ?LIVING ENVIRONMENT: ?Lives with: lives with their spouse ?Lives in: House/apartment ?Stairs: No ?Has following equipment at home: None ? ?OCCUPATION: ?Retired  ? ?PLOF: Independent ? ?PATIENT GOALS Be able to use right arm again. He is a lefty  ? ?OBJECTIVE:  ? ?            VITALS: BP 152/74 SpO2 96 HR 76  ? ?DIAGNOSTIC FINDINGS:  ?None  ? ?PATIENT SURVEYS:  ?FOTO 51/66 ? ?COGNITION: ? Overall cognitive status: Within functional limits for tasks  assessed ?    ?SENSATION: ?WFL ? ?POSTURE: ?Forward Head and Rounded Shoulders  ? ?UPPER EXTREMITY ROM:  ? ?Active ROM Right ?06/05/2021 Left ?06/05/2021  ?Shoulder flexion 45* 160  ?Shoulder extension 60 60  ?Shoulder abduction 45* 160  ?Shoulder adduction    ?Shoulder internal rotation 70 70  ?Shoulder external rotation 90 90  ?Elbow flexion 150 150  ?Elbow extension 0 0  ?Wrist flexion    ?Wrist extension    ?Wrist ulnar deviation    ?Wrist radial deviation    ?Wrist pronation    ?Wrist supination    ?(Blank rows = not tested) ? ? PROM Right ?06/05/2021 Left ?06/05/2021  ?Shoulder flexion 160 160  ?Shoulder extension 60 60  ?Shoulder abduction 160 160  ?Shoulder adduction    ?Shoulder internal rotation 70 70  ?Shoulder external rotation 90 90  ?Elbow flexion 150 150  ?Elbow extension 0 0  ?Wrist flexion    ?Wrist extension    ?Wrist ulnar deviation    ?Wrist radial deviation    ?Wrist pronation    ?Wrist supination    ?  (Blank rows = not tested) ? ?UPPER EXTREMITY MMT: ? ?MMT Right ?06/05/2021 Left ?06/05/2021  ?Shoulder flexion 2+* 4  ?Shoulder extension 5 5  ?Shoulder abduction 2+* 4  ?Shoulder adduction    ?Shoulder internal rotation 4 5  ?Shoulder external rotation 3 5  ?Middle trapezius    ?Lower trapezius    ?Elbow flexion 5 5  ?Elbow extension 5 5  ?Wrist flexion 5 5  ?Wrist extension 5 5  ?Wrist ulnar deviation    ?Wrist radial deviation    ?Wrist pronation    ?Wrist supination    ?Grip strength (lbs) Good  Good   ?(Blank rows = not tested) ? ? ? ? ? ?SHOULDER SPECIAL TESTS: ? Impingement tests: Neer impingement test: positive  and Painful arc test: positive  on RUE  ?  SLAP lesions:  Not tested  ? Instability tests:  Not tested  ? Rotator cuff assessment: Drop arm test: positive , Full can test: positive , Belly press test: negative , and Infraspinatus test: positive  ? Biceps assessment: Speed's test: negative ? ?JOINT MOBILITY TESTING:  ?Deferred to next visit  ? ?PALPATION:  ?Right anterior shoulder tender  to palpation  ?  ?TODAY'S TREATMENT:  ?Shoulder AAROM pulleys flexion and extension 3 x 10 ?Shoulder AAROM pulleys abduction and adduction 3 x 10  ?Upper Trap Stretch 2 x 30 sec  ?-min VC for sequence and hand placement for exercise   ? ? ?PATIENT EDUCATION: ?Education details: Plan of care and appropriate form and technique for exercise  ?Person educated: Patient ?Education method: Explanation, Demonstration, Tactile cues, Verbal cues, and Handouts ?Education comprehension: verbalized understanding, returned demonstration, and verbal cues required ? ? ?HOME EXERCISE PROGRAM: ?Access Code: [email or web address] ?URL: [email or web address] ?Date: 06/04/2021 ?Prepared by: Bradly Chris ? ?Exercises ?- Seated Upper Trapezius Stretch  - 1 x daily - 7 x weekly - 1 sets - 3 reps - 60 hold ?- Seated Shoulder Flexion AAROM with Pulley Behind  - 1 x daily - 7 x weekly - 3 sets - 10 reps ?- Seated Shoulder Abduction AAROM with Pulley Behind  - 1 x daily - 7 x weekly - 3 sets - 10 reps ? ?ASSESSMENT: ? ?CLINICAL IMPRESSION: ?Patient is a 76 y.o. white male who was seen today for physical therapy evaluation and treatment for acute right shoulder pain 2/2 to RTC injury. He exhibits signs and symptoms suggestive of a RTC tear with inability to lift right shoulder overhead with pain and he likely sustained near full tear in infraspinatus and/or supraspinatus tendons of left shoulder.  He exhibits severe right shoulder strength and AROM deficits limiting his ability to perform overhead or lifting tasks with RUE. He will benefit from skilled PT to develop HEP to maintain parascapular and right shoulder strength and maintain right shoulder PROM to prevent frozen shoulder and to be able to perform compensatory right shoulder motions to maintain functional capacity.  ? ? ?OBJECTIVE IMPAIRMENTS decreased ROM, decreased strength, impaired UE functional use, and pain.  ? ?ACTIVITY LIMITATIONS driving, laundry, yard work, shopping, and  yard work.  ? ?PERSONAL FACTORS Age and 3+ comorbidities: HTN, HLD, and h/o prostate cancer   are also affecting patient's functional outcome.  ? ? ?REHAB POTENTIAL: Fair severe decrease in left shoulder AROM indicating severe RTC tear  ?

## 2021-06-11 ENCOUNTER — Ambulatory Visit (INDEPENDENT_AMBULATORY_CARE_PROVIDER_SITE_OTHER): Payer: Medicare HMO | Admitting: *Deleted

## 2021-06-11 DIAGNOSIS — Z0000 Encounter for general adult medical examination without abnormal findings: Secondary | ICD-10-CM | POA: Diagnosis not present

## 2021-06-11 NOTE — Progress Notes (Signed)
Subjective:   Christian Gordon is a 76 y.o. male who presents for Medicare Annual/Subsequent preventive examination.  I connected with  Christian Gordon on 06/11/21 by a telephone enabled telemedicine application and verified that I am speaking with the correct person using two identifiers.   I discussed the limitations of evaluation and management by telemedicine. The patient expressed understanding and agreed to proceed.  Patient location: home  Provider location: in office    Review of Systems     Cardiac Risk Factors include: advanced age (>8mn, >>27women);hypertension;male gender;sedentary lifestyle;family history of premature cardiovascular disease     Objective:    Today's Vitals   06/11/21 1101  PainSc: 6    There is no height or weight on file to calculate BMI.     06/04/2021   10:20 AM 06/08/2020    8:37 AM  Advanced Directives  Does Patient Have a Medical Advance Directive? No Yes  Does patient want to make changes to medical advance directive?  Yes (Christian Gordon - Information given)  Would patient like information on creating a medical advance directive? No - Patient declined     Current Medications (verified) Outpatient Encounter Medications as of 06/11/2021  Medication Sig   amlodipine-atorvastatin (CADUET) 10-10 MG tablet TAKE 1 TABLET BY MOUTH EVERY DAY   fluticasone-salmeterol (WIXELA INHUB) 100-50 MCG/ACT AEPB Inhale 1 puff into the lungs 2 (two) times daily.   predniSONE (DELTASONE) 20 MG tablet 2 tabs po daily for 5 days, then 1 tab po daily for 5 days   tamsulosin (FLOMAX) 0.4 MG CAPS capsule TAKE 2 CAPSULES BY MOUTH EVERY DAY   traMADol (ULTRAM) 50 MG tablet Take 1 tablet (50 mg total) by mouth every 8 (eight) hours as needed for moderate pain.   No facility-administered encounter medications on file as of 06/11/2021.    Allergies (verified) Patient has no known allergies.   History: Past Medical History:  Diagnosis Date    Deaf, right    Dizzinesses    Hyperlipidemia    Hypertension    [place]state cancer ([place]    SOB (shortness of breath)    Past Surgical History:  Procedure Laterality Date   DENTAL SURGERY     Family History  Problem Relation Age of Onset   Heart disease Mother    Heart disease Father    Hypertension Father    Social History   Socioeconomic History   Marital status: Married    Spouse name: Christian Gordon   Number of children: 1   Years of education: some college   Highest education level: Not on file  Occupational History   Not on file  Tobacco Use   Smoking status: Former    Packs/day: 1.00    Years: 60.00    Pack years: 60.00    Types: Cigarettes    Start date: 171   Quit date: 03/03/2019    Years since quitting: 2.2   Smokeless tobacco: Never  Vaping Use   Vaping Use: Never used  Substance and Sexual Activity   Alcohol use: Yes    Alcohol/week: 1.0 standard drink    Types: 1 Cans of beer per week    Comment: one beer per month    Drug use: Never   Sexual activity: Not Currently    Partners: Female    Birth control/protection: None, Post-menopausal  Other Topics Concern   Not on file  Social History Narrative   Left handed   Lives with wife, in a  condo   From [place]      06/28/19   From: [place] moved to [facility] Feb 2021   Living: with partner Christian Gordon   Work: retired Medical illustrator      Family: Son - Christian Gordon, step grandchildren      Enjoys: watch baseball, casinos       Exercise: not currently   Diet: [place] food, eating more fruit/veggies      Safety   Seat belts: Yes    Guns: Yes  and secure   Safe in relationships: Yes    Social Determinants of Radio broadcast assistant Strain: Low Risk    Difficulty of Paying Living Expenses: Not hard at all  Food Insecurity: No Food Insecurity   Worried About Charity fundraiser in the Last Year: Never true   Arboriculturist in the Last Year: Never true  Transportation  Needs: No Transportation Needs   Lack of Transportation (Medical): No   Lack of Transportation (Non-Medical): No  Physical Activity: Inactive   Days of Exercise per Week: 0 days   Minutes of Exercise per Session: 0 min  Stress: No Stress Concern Present   Feeling of Stress : Not at all  Social Connections: Socially Isolated   Frequency of Communication with Friends and Family: Once a week   Frequency of Social Gatherings with Friends and Family: Once a week   Attends Religious Services: Never   Marine scientist or Organizations: No   Attends Music therapist: Never   Marital Status: Living with partner    Tobacco Counseling Counseling given: Not Answered   Clinical Intake:  Pre-visit preparation completed: Yes  Pain : 0-10 Pain Score: 6  Pain Type: Chronic pain Pain Location:  (torn rotar cuff) Pain Orientation: Right Pain Descriptors / Indicators: Burning, Aching Pain Onset: 1 to 4 weeks ago Pain Frequency: Intermittent     Nutritional Risks: None Diabetes: No  How often do you need to have someone help you when you read instructions, pamphlets, or other written materials from your doctor or pharmacy?: 1 - Never  Diabetic?  no  Interpreter Needed?: No  Information entered by :: Christian Kennedy LPN   Activities of Daily Living    06/11/2021   11:14 AM  In your present state of health, do you have any difficulty performing the following activities:  Hearing? 1  Vision? 0  Difficulty concentrating or making decisions? 0  Walking or climbing stairs? 0  Dressing or bathing? 0  Doing errands, shopping? 0  Preparing Food and eating ? N  Using the Toilet? N  In the past six months, have you accidently leaked urine? N  Do you have problems with loss of bowel control? N  Managing your Medications? N  Managing your Finances? N    Patient Care Team: Christian Noe, MD as PCP - General (Family Medicine) Christian Gordon, DPM as Consulting  Physician (Podiatry)  Indicate any recent Medical Services you may have received from other than [facility] providers in the past year (date may be approximate).     Assessment:   This is a routine wellness examination for Christian Gordon.  Hearing/Vision screen Hearing Screening - Comments:: Deaf in R ear Hearing loss in left ear No hearing aids Vision Screening - Comments:: Not up date  Dietary issues and exercise activities discussed: Current Exercise Habits: The patient does not participate in regular exercise at present   Goals  Addressed             This Visit's Progress    Increase physical activity         Depression Screen    06/11/2021   11:06 AM 06/08/2020    9:09 AM 06/08/2020    8:39 AM 10/06/2019   12:06 PM  PHQ 2/9 Scores  PHQ - 2 Score 0 0 0 1  PHQ- 9 Score    6    Fall Risk    06/11/2021   11:03 AM 06/08/2020    8:38 AM 06/08/2020    8:19 AM 10/06/2019   11:26 AM  Fall Risk   Falls in the past year? 0 0 0 0  Number falls in past yr: 0 0 0 0  Injury with Fall? 0 0    Risk for fall due to :  Impaired vision    Follow up Falls evaluation completed;Education provided;Falls prevention discussed Falls evaluation completed      FALL RISK PREVENTION PERTAINING TO THE HOME:  Any stairs in or around the home? Yes  If so, are there any without handrails? No  Home free of loose throw rugs in walkways, pet beds, electrical cords, etc? Yes  Adequate lighting in your home to reduce risk of falls? Yes   ASSISTIVE DEVICES UTILIZED TO PREVENT FALLS:  Life alert? No  Use of a cane, walker or w/c? No  Grab bars in the bathroom? No  Shower chair or bench in shower? Yes  Elevated toilet seat or a handicapped toilet? No   TIMED UP AND GO:  Was the test performed? No .    Cognitive Function:        06/11/2021   11:03 AM  6CIT Screen  What Year? 0 points  What month? 0 points  What time? 0 points  Count back from 20 0 points  Months in reverse 0 points  Repeat  phrase 0 points  Total Score 0 points    Immunizations Immunization History  Administered Date(s) Administered   PFIZER(Purple Top)SARS-COV-2 Vaccination 03/22/2019, 04/18/2019, 07/19/2020   PNEUMOCOCCAL CONJUGATE-20 06/08/2020   Zoster Recombinat (Shingrix) 08/01/2020, 01/10/2021    TDAP status: Due, Education has been provided regarding the importance of this vaccine. Advised may receive this vaccine at local pharmacy or Health Dept. Aware to provide a copy of the vaccination record if obtained from local pharmacy or Health Dept. Verbalized acceptance and understanding.  Flu Vaccine status: Declined, Education has been provided regarding the importance of this vaccine but patient still declined. Advised may receive this vaccine at local pharmacy or Health Dept. Aware to provide a copy of the vaccination record if obtained from local pharmacy or Health Dept. Verbalized acceptance and understanding.  Pneumococcal vaccine status: Due, Education has been provided regarding the importance of this vaccine. Advised may receive this vaccine at local pharmacy or Health Dept. Aware to provide a copy of the vaccination record if obtained from local pharmacy or Health Dept. Verbalized acceptance and understanding.  Covid-19 vaccine status: Information provided on how to obtain vaccines.   Qualifies for Shingles Vaccine? No   Zostavax completed No   Shingrix Completed?: Yes  Screening Tests Health Maintenance  Topic Date Due   COVID-19 Vaccine (4 - Booster for Pfizer series) 06/27/2021 (Originally 09/13/2020)   TETANUS/TDAP  06/12/2022 (Originally 12/26/1964)   INFLUENZA VACCINE  08/21/2021   Pneumonia Vaccine 55+ Years old  Completed   Hepatitis C Screening  Completed   Zoster Vaccines- Shingrix  Completed  HPV VACCINES  Aged Out   COLONOSCOPY (Pts 45-77yr Insurance coverage will need to be confirmed)  Discontinued    Health Maintenance  There are no preventive care reminders to display  for this patient.   Colorectal cancer screening: No longer required.   Lung Cancer Screening: (Low Dose CT Chest recommended if Age 78-80years, 30 pack-year currently smoking OR have quit w/in 15years.)  qualify.   Lung Cancer Screening Referral:   Additional Screening:  Hepatitis C Screening: does not qualify; Completed 2022  Vision Screening: Recommended annual ophthalmology exams for early detection of glaucoma and other disorders of the eye. Is the patient up to date with their annual eye exam?  No  Who is the provider or what is the name of the office in which the patient attends annual eye exams?  If pt is not established with a provider, would they like to be referred to a provider to establish care? No .   Dental Screening: Recommended annual dental exams for proper oral hygiene  Community Resource Referral / Chronic Care Management: CRR required this visit?  No   CCM required this visit?  No      Plan:     I have personally reviewed and noted the following in the patient's chart:   Medical and social history Use of alcohol, tobacco or illicit drugs  Current medications and supplements including opioid prescriptions. Patient is not currently taking opioid prescriptions. Functional ability and status Nutritional status Physical activity Advanced directives List of other physicians Hospitalizations, surgeries, and ER visits in previous 12 months Vitals Screenings to include cognitive, depression, and falls Referrals and appointments  In addition, I have reviewed and discussed with patient certain preventive protocols, quality metrics, and best practice recommendations. A written personalized care plan for preventive services as well as general preventive health recommendations were provided to patient.     JLeroy Kennedy LPN   [id number]  Nurse Notes:

## 2021-06-11 NOTE — Patient Instructions (Signed)
Mr. Benish , Thank you for taking time to come for your Medicare Wellness Visit. I appreciate your ongoing commitment to your health goals. Please review the following plan we discussed and let me know if I can assist you in the future.   Screening recommendations/referrals: Colonoscopy: no longer required Recommended yearly ophthalmology/optometry visit for glaucoma screening and checkup Recommended yearly dental visit for hygiene and checkup  Vaccinations: Influenza vaccine: Education provided Pneumococcal vaccine: Education provided Tdap vaccine: Education provided Shingles vaccine: up to date    Advanced directives: Education provided    Preventive Care 11 Years and Older, Male Preventive care refers to lifestyle choices and visits with your health care provider that can promote health and wellness. What does preventive care include? A yearly physical exam. This is also called an annual well check. Dental exams once or twice a year. Routine eye exams. Ask your health care provider how often you should have your eyes checked. Personal lifestyle choices, including: Daily care of your teeth and gums. Regular physical activity. Eating a healthy diet. Avoiding tobacco and drug use. Limiting alcohol use. Practicing safe sex. Taking low doses of aspirin every day. Taking vitamin and mineral supplements as recommended by your health care provider. What happens during an annual well check? The services and screenings done by your health care provider during your annual well check will depend on your age, overall health, lifestyle risk factors, and family history of disease. Counseling  Your health care provider may ask you questions about your: Alcohol use. Tobacco use. Drug use. Emotional well-being. Home and relationship well-being. Sexual activity. Eating habits. History of falls. Memory and ability to understand (cognition). Work and work Statistician. Screening  You  may have the following tests or measurements: Height, weight, and BMI. Blood pressure. Lipid and cholesterol levels. These may be checked every 5 years, or more frequently if you are over 26 years old. Skin check. Lung cancer screening. You may have this screening every year starting at age 36 if you have a 30-pack-year history of smoking and currently smoke or have quit within the past 15 years. Fecal occult blood test (FOBT) of the stool. You may have this test every year starting at age 51. Flexible sigmoidoscopy or colonoscopy. You may have a sigmoidoscopy every 5 years or a colonoscopy every 10 years starting at age 40. Prostate cancer screening. Recommendations will vary depending on your family history and other risks. Hepatitis C blood test. Hepatitis B blood test. Sexually transmitted disease (STD) testing. Diabetes screening. This is done by checking your blood sugar (glucose) after you have not eaten for a while (fasting). You may have this done every 1-3 years. Abdominal aortic aneurysm (AAA) screening. You may need this if you are a current or former smoker. Osteoporosis. You may be screened starting at age 74 if you are at high risk. Talk with your health care provider about your test results, treatment options, and if necessary, the need for more tests. Vaccines  Your health care provider may recommend certain vaccines, such as: Influenza vaccine. This is recommended every year. Tetanus, diphtheria, and acellular pertussis (Tdap, Td) vaccine. You may need a Td booster every 10 years. Zoster vaccine. You may need this after age 51. Pneumococcal 13-valent conjugate (PCV13) vaccine. One dose is recommended after age 58. Pneumococcal polysaccharide (PPSV23) vaccine. One dose is recommended after age 79. Talk to your health care provider about which screenings and vaccines you need and how often you need them. This information is  not intended to replace advice given to you by your  health care provider. Make sure you discuss any questions you have with your health care provider. Document Released: 02/03/2015 Document Revised: 09/27/2015 Document Reviewed: 11/08/2014 Elsevier Interactive Patient Education  2017 [place] Prevention in the Home Falls can cause injuries. They can happen to people of all ages. There are many things you can do to make your home safe and to help prevent falls. What can I do on the outside of my home? Regularly fix the edges of walkways and driveways and fix any cracks. Remove anything that might make you trip as you walk through a door, such as a raised step or threshold. Trim any bushes or trees on the path to your home. Use bright outdoor lighting. Clear any walking paths of anything that might make someone trip, such as rocks or tools. Regularly check to see if handrails are loose or broken. Make sure that both sides of any steps have handrails. Any raised decks and porches should have guardrails on the edges. Have any leaves, snow, or ice cleared regularly. Use sand or salt on walking paths during winter. Clean up any spills in your garage right away. This includes oil or grease spills. What can I do in the bathroom? Use night lights. Install grab bars by the toilet and in the tub and shower. Do not use towel bars as grab bars. Use non-skid mats or decals in the tub or shower. If you need to sit down in the shower, use a plastic, non-slip stool. Keep the floor dry. Clean up any water that spills on the floor as soon as it happens. Remove soap buildup in the tub or shower regularly. Attach bath mats securely with double-sided non-slip rug tape. Do not have throw rugs and other things on the floor that can make you trip. What can I do in the bedroom? Use night lights. Make sure that you have a light by your bed that is easy to reach. Do not use any sheets or blankets that are too big for your bed. They should not hang down  onto the floor. Have a firm chair that has side arms. You can use this for support while you get dressed. Do not have throw rugs and other things on the floor that can make you trip. What can I do in the kitchen? Clean up any spills right away. Avoid walking on wet floors. Keep items that you use a lot in easy-to-reach places. If you need to reach something above you, use a strong step stool that has a grab bar. Keep electrical cords out of the way. Do not use floor polish or wax that makes floors slippery. If you must use wax, use non-skid floor wax. Do not have throw rugs and other things on the floor that can make you trip. What can I do with my stairs? Do not leave any items on the stairs. Make sure that there are handrails on both sides of the stairs and use them. Fix handrails that are broken or loose. Make sure that handrails are as long as the stairways. Check any carpeting to make sure that it is firmly attached to the stairs. Fix any carpet that is loose or worn. Avoid having throw rugs at the top or bottom of the stairs. If you do have throw rugs, attach them to the floor with carpet tape. Make sure that you have a light switch at the top of the  stairs and the bottom of the stairs. If you do not have them, ask someone to add them for you. What else can I do to help prevent falls? Wear shoes that: Do not have high heels. Have rubber bottoms. Are comfortable and fit you well. Are closed at the toe. Do not wear sandals. If you use a stepladder: Make sure that it is fully opened. Do not climb a closed stepladder. Make sure that both sides of the stepladder are locked into place. Ask someone to hold it for you, if possible. Clearly mark and make sure that you can see: Any grab bars or handrails. First and last steps. Where the edge of each step is. Use tools that help you move around (mobility aids) if they are needed. These include: Canes. Walkers. Scooters. Crutches. Turn  on the lights when you go into a dark area. Replace any light bulbs as soon as they burn out. Set up your furniture so you have a clear path. Avoid moving your furniture around. If any of your floors are uneven, fix them. If there are any pets around you, be aware of where they are. Review your medicines with your doctor. Some medicines can make you feel dizzy. This can increase your chance of falling. Ask your doctor what other things that you can do to help prevent falls. This information is not intended to replace advice given to you by your health care provider. Make sure you discuss any questions you have with your health care provider. Document Released: 11/03/2008 Document Revised: 06/15/2015 Document Reviewed: 02/11/2014 Elsevier Interactive Patient Education  2017 [employer].

## 2021-06-13 ENCOUNTER — Ambulatory Visit: Payer: Medicare HMO | Admitting: Physical Therapy

## 2021-06-13 ENCOUNTER — Encounter: Payer: Self-pay | Admitting: Physical Therapy

## 2021-06-13 DIAGNOSIS — M75101 Unspecified rotator cuff tear or rupture of right shoulder, not specified as traumatic: Secondary | ICD-10-CM

## 2021-06-13 DIAGNOSIS — M25511 Pain in right shoulder: Secondary | ICD-10-CM

## 2021-06-13 DIAGNOSIS — M6281 Muscle weakness (generalized): Secondary | ICD-10-CM

## 2021-06-13 NOTE — Therapy (Signed)
OUTPATIENT PHYSICAL THERAPY TREATMENT NOTE   Patient Name: Christian Gordon MRN: [id number] DOB:1945-07-20, 76 y.o., male Today's Date: 06/13/2021  PCP: Dr. Waunita Schooner  REFERRING PROVIDER: Dr. Frederico Hamman Copland   END OF SESSION:   PT End of Session - 06/13/21 0937     Visit Number 2    Number of Visits 16    Date for PT Re-Evaluation 07/30/21    PT Start Time 0930    PT Stop Time 1015    PT Time Calculation (min) 45 min    Activity Tolerance Patient limited by pain    Behavior During Therapy [facility] for tasks assessed/performed             Past Medical History:  Diagnosis Date   Deaf, right    Dizzinesses    Hyperlipidemia    Hypertension    Prostate cancer ([place])    SOB (shortness of breath)    Past Surgical History:  Procedure Laterality Date   DENTAL SURGERY     Patient Active Problem List   Diagnosis Date Noted   Right leg swelling 05/03/2020   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   Bunion of right foot 10/06/2019   Pruritus 10/06/2019   Liver lesion 07/22/2019   Atherosclerosis 07/22/2019   Carotid artery stenosis 06/28/2019   History of prostate cancer 06/28/2019   Hyperlipidemia 06/28/2019   Hypertension 06/28/2019   Deafness 06/28/2019   COPD (chronic obstructive pulmonary disease) ([place]) 06/28/2019   History of tobacco use 06/28/2019    REFERRING DIAG:   M25.511,G89.11 (ICD-10-CM) - Acute pain of right shoulder due to trauma R29.898 (ICD-10-CM) - Shoulder weakness M24.811 (ICD-10-CM) - Internal derangement of right shoulder M54.9,G89.29 (ICD-10-CM) - Chronic back pain greater than 3 months duration  THERAPY DIAG:  Acute pain of right shoulder  Muscle weakness (generalized)  Nontraumatic tear of right rotator cuff, unspecified tear extent  Rationale for Evaluation and Treatment Rehabilitation  PERTINENT HISTORY: Per Dr. Lillie Fragmin Note on  05/28/21   Christian Gordon is a 76 y.o. very pleasant male patient with Body mass index  is 26.67 kg/m. who presents with the following:   Pleasant gentleman and roughly 1 months ago he was lifting a 35 pound bag and threw his right arm up and then sustained an acute injury.  At that point he had immediate pain and since then he has had extreme difficulty abducting the shoulder.  He has pain essentially all of the time.  He has a deep dull ache in the shoulder and in and about the upper arm and some into the shoulder blade.  He denies any radicular pain denies any neck pain.  No prior operative intervention, major fracture, or dislocation in the affected shoulder.   He also has some ongoing intermittent back pain in the lumbar spine without any form of radiculopathy.  No prior operations, major injury, and he does have a deep dull ache localized to the low back without any radiation to any other part of the back or buttocks or legs.  No numbness or tingling.  PRECAUTIONS: None   SUBJECTIVE: Pt reports an improvement in his shoulder pain but that he did have an incident when he was performing shoulder AAROM abduction pulleys where he triggered more pain.   PAIN:  Are you having pain? Yes: NPRS scale: 7/10 Pain location: Right Shoulder AC joint and anterior of GH joint  Pain description: Achy  Aggravating factors: Lifting overhead  Relieving factors: Keeping his shoulder by his side.  OBJECTIVE: (objective measures completed at initial evaluation unless otherwise dated)              VITALS: BP 152/74 SpO2 96 HR 76    DIAGNOSTIC FINDINGS:  None    PATIENT SURVEYS:  FOTO 51/66   COGNITION:           Overall cognitive status: Within functional limits for tasks assessed                                  SENSATION: WFL   POSTURE: Forward Head and Rounded Shoulders    UPPER EXTREMITY ROM:    Active ROM Right 06/05/2021 Left 06/05/2021  Shoulder flexion 45* 160  Shoulder extension 60 60  Shoulder abduction 45* 160  Shoulder adduction      Shoulder internal rotation 70  70  Shoulder external rotation 90 90  Elbow flexion 150 150  Elbow extension 0 0  Wrist flexion      Wrist extension      Wrist ulnar deviation      Wrist radial deviation      Wrist pronation      Wrist supination      (Blank rows = not tested)    PROM Right 06/05/2021 Left 06/05/2021  Shoulder flexion 160 160  Shoulder extension 60 60  Shoulder abduction 160 160  Shoulder adduction      Shoulder internal rotation 70 70  Shoulder external rotation 90 90  Elbow flexion 150 150  Elbow extension 0 0  Wrist flexion      Wrist extension      Wrist ulnar deviation      Wrist radial deviation      Wrist pronation      Wrist supination        (Blank rows = not tested)   UPPER EXTREMITY MMT:   MMT Right 06/05/2021 Left 06/05/2021  Shoulder flexion 2+* 4  Shoulder extension 5 5  Shoulder abduction 2+* 4  Shoulder adduction      Shoulder internal rotation 4 5  Shoulder external rotation 3 5  Middle trapezius      Lower trapezius      Elbow flexion 5 5  Elbow extension 5 5  Wrist flexion 5 5  Wrist extension 5 5  Wrist ulnar deviation      Wrist radial deviation      Wrist pronation      Wrist supination      Grip strength (lbs) Good  Good   (Blank rows = not tested)          SHOULDER SPECIAL TESTS:            Impingement tests: Neer impingement test: positive  and Painful arc test: positive  on RUE             SLAP lesions:  Not tested             Instability tests:  Not tested             Rotator cuff assessment: Drop arm test: positive , Full can test: positive , Belly press test: negative , and Infraspinatus test: positive             Biceps assessment: Speed's test: negative   JOINT MOBILITY TESTING:  Deferred to next visit    PALPATION:  Right anterior shoulder tender to palpation  TODAY'S TREATMENT:   06/13/21  THEREX:  UBE Seat 11 and resistance 3 for 5 min  Scapular Rows with Green TB 1 x 10  Scapular Rows with Gray TB 3 x 10   Shoulder ER at 0 deg abduction Isometric hold with 3 sec hold 2 x 8  Shoulder IR at 0 deg abduction with YTB 3 x 10   MANUAL THERAPY:  Upper trap stretch  Suboccipital release  Right upper trap trigger point release     Initial Eval:  Shoulder AAROM pulleys flexion and extension 3 x 10 Shoulder AAROM pulleys abduction and adduction 3 x 10  Upper Trap Stretch 2 x 30 sec  -min VC for sequence and hand placement for exercise       PATIENT EDUCATION: Education details: Plan of care and appropriate form and technique for exercise  Person educated: Patient Education method: Explanation, Demonstration, Tactile cues, Verbal cues, and Handouts Education comprehension: verbalized understanding, returned demonstration, and verbal cues required     HOME EXERCISE PROGRAM: Access Code: [email or web address] URL: [email or web address] Date: 06/13/2021 Prepared by: Bradly Chris  Exercises - Seated Upper Trapezius Stretch  - 1 x daily - 7 x weekly - 1 sets - 3 reps - 60 hold - Seated Shoulder Flexion AAROM with Pulley Behind  - 1 x daily - 7 x weekly - 3 sets - 10 reps - Seated Shoulder Row with Anchored Resistance  - 1 x daily - 3 x weekly - 3 sets - 10 reps - 1 hold - Isometric Shoulder External Rotation at Wall  - 1 x daily - 3 x weekly - 2 sets - 10 reps - 3 hold - Isometric Shoulder Flexion at Wall  - 1 x daily - 3 x weekly - 2 sets - 10 reps - 3 hold - Shoulder Internal Rotation with Resistance  - 1 x daily - 3 x weekly - 3 sets - 10 reps   ASSESSMENT:   CLINICAL IMPRESSION:  Pt able to perform all exercises without an increase in his pain. He is limited by shortness of breath and deconditioning as he has not exercised for the past several years. Allowed increased rest breaks between exercises to allow him to catch his breath. He will continue to benefit from skilled PT to develop HEP to maintain parascapular and right shoulder strength and maintain right shoulder PROM to prevent  frozen shoulder and to be able to perform compensatory right shoulder motions to maintain functional capacity.      OBJECTIVE IMPAIRMENTS decreased ROM, decreased strength, impaired UE functional use, and pain.    ACTIVITY LIMITATIONS driving, laundry, yard work, shopping, and yard work.    PERSONAL FACTORS Age and 3+ comorbidities: HTN, HLD, and h/o prostate cancer   are also affecting patient's functional outcome.      REHAB POTENTIAL: Fair severe decrease in left shoulder AROM indicating severe RTC tear    CLINICAL DECISION MAKING: Stable/uncomplicated   EVALUATION COMPLEXITY: Low     GOALS: Goals reviewed with patient? No   SHORT TERM GOALS: Target date: 06/19/2021  (Remove Blue Hyperlink)   Pt will be independent with HEP in order to improve strength and balance in order to decrease fall risk and improve function at home and work.   Baseline: NT  Goal status: INITIAL   2.  Pt will improve right shoulder AROM to 90 deg flexion and abduction to return functional capacity of RUE for overhead activity.  Baseline: Shoulder Flexion R 45, Shoulder Abduction R 45  Goal status: INITIAL       LONG TERM GOALS: Target date: 07/31/2021  (Remove Blue Hyperlink)   Patient will have improved function and activity level as evidenced by an increase in FOTO score by 10 points or more.  Baseline: 51 Goal status: INITIAL   2.  Patient will improve right shoulder strength to be close to symmetrical to left shoulder to better be able to perform functional UE tasks such as lifting things like tools to prune bushes or place garbage in trash can.   Baseline: Shoulder Flexion R/L 2+/4, Shoulder Abduction R/L 2+/4 Goal status: INITIAL       PLAN: PT FREQUENCY: 1-2x/week   PT DURATION: 8 weeks   PLANNED INTERVENTIONS: Therapeutic exercises, Therapeutic activity, Neuromuscular re-education, Balance training, Gait training, Patient/Family education, Joint manipulation, Joint mobilization, Dry  Needling, Electrical stimulation, Cryotherapy, Moist heat, and Manual therapy   PLAN FOR NEXT SESSION: Assess joint play and progress shoulder AROM and begin parascapular strengthening   Bradly Chris PT, DPT  Daneil Dan, PT 06/13/2021, 9:38 AM

## 2021-06-21 ENCOUNTER — Ambulatory Visit: Payer: Medicare HMO | Attending: Family Medicine | Admitting: Physical Therapy

## 2021-06-21 ENCOUNTER — Encounter: Payer: Self-pay | Admitting: Physical Therapy

## 2021-06-21 DIAGNOSIS — M25511 Pain in right shoulder: Secondary | ICD-10-CM

## 2021-06-21 DIAGNOSIS — M75101 Unspecified rotator cuff tear or rupture of right shoulder, not specified as traumatic: Secondary | ICD-10-CM | POA: Diagnosis present

## 2021-06-21 DIAGNOSIS — M6281 Muscle weakness (generalized): Secondary | ICD-10-CM

## 2021-06-21 NOTE — Therapy (Signed)
OUTPATIENT PHYSICAL THERAPY TREATMENT NOTE   Patient Name: Christian Gordon MRN: [id number] DOB:03-18-1945, 76 y.o., male Today's Date: 06/21/2021  PCP: Dr. Waunita Schooner  REFERRING PROVIDER: Dr. Frederico Hamman Copland   END OF SESSION:   PT End of Session - 06/21/21 1148     Visit Number 3    Number of Visits 16    Date for PT Re-Evaluation 07/30/21    PT Start Time 6010    PT Stop Time 1230    PT Time Calculation (min) 45 min    Activity Tolerance Patient limited by pain    Behavior During Therapy [facility] for tasks assessed/performed             Past Medical History:  Diagnosis Date   Deaf, right    Dizzinesses    Hyperlipidemia    Hypertension    Prostate cancer ([place])    SOB (shortness of breath)    Past Surgical History:  Procedure Laterality Date   DENTAL SURGERY     Patient Active Problem List   Diagnosis Date Noted   Right leg swelling 05/03/2020   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   Bunion of right foot 10/06/2019   Pruritus 10/06/2019   Liver lesion 07/22/2019   Atherosclerosis 07/22/2019   Carotid artery stenosis 06/28/2019   History of prostate cancer 06/28/2019   Hyperlipidemia 06/28/2019   Hypertension 06/28/2019   Deafness 06/28/2019   COPD (chronic obstructive pulmonary disease) ([place]) 06/28/2019   History of tobacco use 06/28/2019    REFERRING DIAG:   M25.511,G89.11 (ICD-10-CM) - Acute pain of right shoulder due to trauma R29.898 (ICD-10-CM) - Shoulder weakness M24.811 (ICD-10-CM) - Internal derangement of right shoulder M54.9,G89.29 (ICD-10-CM) - Chronic back pain greater than 3 months duration  THERAPY DIAG:  Acute pain of right shoulder  Muscle weakness (generalized)  Nontraumatic tear of right rotator cuff, unspecified tear extent  Rationale for Evaluation and Treatment Rehabilitation  PERTINENT HISTORY: Per Dr. Lillie Fragmin Note on  05/28/21   Christian Gordon is a 76 y.o. very pleasant male patient with Body mass index  is 26.67 kg/m. who presents with the following:   Pleasant gentleman and roughly 1 months ago he was lifting a 35 pound bag and threw his right arm up and then sustained an acute injury.  At that point he had immediate pain and since then he has had extreme difficulty abducting the shoulder.  He has pain essentially all of the time.  He has a deep dull ache in the shoulder and in and about the upper arm and some into the shoulder blade.  He denies any radicular pain denies any neck pain.  No prior operative intervention, major fracture, or dislocation in the affected shoulder.   He also has some ongoing intermittent back pain in the lumbar spine without any form of radiculopathy.  No prior operations, major injury, and he does have a deep dull ache localized to the low back without any radiation to any other part of the back or buttocks or legs.  No numbness or tingling.  PRECAUTIONS: None   SUBJECTIVE: Pt states that he has felt increased pain with right shoulder ER with resistance.    PAIN:  Are you having pain? Yes: NPRS scale: 3/10 Pain location: Right Shoulder AC joint and anterior of GH joint  Pain description: Achy  Aggravating factors: Lifting overhead  Relieving factors: Keeping his shoulder by his side.   OBJECTIVE: (objective measures completed at initial evaluation unless otherwise dated)  VITALS: BP 152/74 SpO2 96 HR 76    DIAGNOSTIC FINDINGS:  None    PATIENT SURVEYS:  FOTO 51/66   COGNITION:           Overall cognitive status: Within functional limits for tasks assessed                                  SENSATION: WFL   POSTURE: Forward Head and Rounded Shoulders    UPPER EXTREMITY ROM:    Active ROM Right 06/05/2021 Left 06/05/2021  Shoulder flexion 45* 160  Shoulder extension 60 60  Shoulder abduction 45* 160  Shoulder adduction      Shoulder internal rotation 70 70  Shoulder external rotation 90 90  Elbow flexion 150 150  Elbow extension 0  0  Wrist flexion      Wrist extension      Wrist ulnar deviation      Wrist radial deviation      Wrist pronation      Wrist supination      (Blank rows = not tested)    PROM Right 06/05/2021 Left 06/05/2021  Shoulder flexion 160 160  Shoulder extension 60 60  Shoulder abduction 160 160  Shoulder adduction      Shoulder internal rotation 70 70  Shoulder external rotation 90 90  Elbow flexion 150 150  Elbow extension 0 0  Wrist flexion      Wrist extension      Wrist ulnar deviation      Wrist radial deviation      Wrist pronation      Wrist supination        (Blank rows = not tested)   UPPER EXTREMITY MMT:   MMT Right 06/05/2021 Left 06/05/2021  Shoulder flexion 2+* 4  Shoulder extension 5 5  Shoulder abduction 2+* 4  Shoulder adduction      Shoulder internal rotation 4 5  Shoulder external rotation 3 5  Middle trapezius      Lower trapezius      Elbow flexion 5 5  Elbow extension 5 5  Wrist flexion 5 5  Wrist extension 5 5  Wrist ulnar deviation      Wrist radial deviation      Wrist pronation      Wrist supination      Grip strength (lbs) Good  Good   (Blank rows = not tested)          SHOULDER SPECIAL TESTS:            Impingement tests: Neer impingement test: positive  and Painful arc test: positive  on RUE             SLAP lesions:  Not tested             Instability tests:  Not tested             Rotator cuff assessment: Drop arm test: positive , Full can test: positive , Belly press test: negative , and Infraspinatus test: positive             Biceps assessment: Speed's test: negative   JOINT MOBILITY TESTING:  Deferred to next visit    PALPATION:  Right anterior shoulder tender to palpation              TODAY'S TREATMENT:   06/21/21 UBE Seat 11 and resistance 3 for 5 min   OMEGA Seated  Scap Rows #25 3 x 10  -Pt experiences increased pain reaching forward with [place] Chest Press #5 2 x 10  -Pt reports increased right arm pain and  needs to terminate activity   Push-Up with Plus 2 x 10  -Increased right arm pain   06/13/21  THEREX:  UBE Seat 11 and resistance 3 for 5 min  Scapular Rows with Green TB 1 x 10  Scapular Rows with Gray TB 3 x 10  Shoulder ER at 0 deg abduction Isometric hold with 3 sec hold 2 x 8  Shoulder IR at 0 deg abduction with YTB 3 x 10   MANUAL THERAPY:  Upper trap stretch  Suboccipital release  Right upper trap trigger point release     Initial Eval:  Shoulder AAROM pulleys flexion and extension 3 x 10 Shoulder AAROM pulleys abduction and adduction 3 x 10  Upper Trap Stretch 2 x 30 sec  -min VC for sequence and hand placement for exercise       PATIENT EDUCATION: Education details: Plan of care and appropriate form and technique for exercise  Person educated: Patient Education method: Explanation, Demonstration, Tactile cues, Verbal cues, and Handouts Education comprehension: verbalized understanding, returned demonstration, and verbal cues required     HOME EXERCISE PROGRAM: Access Code: [email or web address] URL: [email or web address] Date: 06/21/2021 Prepared by: Bradly Chris  Exercises - Seated Upper Trapezius Stretch  - 1 x daily - 7 x weekly - 1 sets - 3 reps - 60 hold - Seated Shoulder Flexion AAROM with Pulley Behind  - 1 x daily - 7 x weekly - 3 sets - 10 reps - Seated Shoulder Row with Anchored Resistance  - 1 x daily - 3 x weekly - 3 sets - 10 reps - 1 hold - Isometric Shoulder External Rotation at Wall  - 1 x daily - 3 x weekly - 2 sets - 10 reps - 3 hold - Isometric Shoulder Flexion at Wall  - 1 x daily - 3 x weekly - 2 sets - 10 reps - 3 hold - Wall Push Up with Plus  - 1 x daily - 3 x weekly - 3 sets - 10 reps - Standing Isometric Shoulder Internal Rotation at Doorway  - 1 x daily - 3 x weekly - 2 sets - 10 reps - 3 hold   ASSESSMENT:   CLINICAL IMPRESSION:  Pt limited by anterior right shoulder pain when performing shoulder adduction. Modified  exercises so that pt could perform with a minor increase in pain. He performed right shoulder flexion with pain and discomfort for the first time. He will continue to benefit from skilled PT to develop HEP to maintain parascapular and right shoulder strength and maintain right shoulder PROM to prevent frozen shoulder and to be able to perform compensatory right shoulder motions to maintain functional capacity.      OBJECTIVE IMPAIRMENTS decreased ROM, decreased strength, impaired UE functional use, and pain.    ACTIVITY LIMITATIONS driving, laundry, yard work, shopping, and yard work.    PERSONAL FACTORS Age and 3+ comorbidities: HTN, HLD, and h/o prostate cancer   are also affecting patient's functional outcome.      REHAB POTENTIAL: Fair severe decrease in left shoulder AROM indicating severe RTC tear    CLINICAL DECISION MAKING: Stable/uncomplicated   EVALUATION COMPLEXITY: Low     GOALS: Goals reviewed with patient? No   SHORT TERM GOALS: Target date: 06/19/2021  (Remove Blue Hyperlink)   Pt will be  independent with HEP in order to improve strength and balance in order to decrease fall risk and improve function at home and work.   Baseline: NT  Goal status: INITIAL   2.  Pt will improve right shoulder AROM to 90 deg flexion and abduction to return functional capacity of RUE for overhead activity.  Baseline: Shoulder Flexion R 45, Shoulder Abduction R 45  Goal status: INITIAL       LONG TERM GOALS: Target date: 07/31/2021  (Remove Blue Hyperlink)   Patient will have improved function and activity level as evidenced by an increase in FOTO score by 10 points or more.  Baseline: 51 Goal status: INITIAL   2.  Patient will improve right shoulder strength to be close to symmetrical to left shoulder to better be able to perform functional UE tasks such as lifting things like tools to prune bushes or place garbage in trash can.   Baseline: Shoulder Flexion R/L 2+/4, Shoulder  Abduction R/L 2+/4 Goal status: INITIAL       PLAN: PT FREQUENCY: 1-2x/week   PT DURATION: 8 weeks   PLANNED INTERVENTIONS: Therapeutic exercises, Therapeutic activity, Neuromuscular re-education, Balance training, Gait training, Patient/Family education, Joint manipulation, Joint mobilization, Dry Needling, Electrical stimulation, Cryotherapy, Moist heat, and Manual therapy   PLAN FOR NEXT SESSION: Assess joint play and progress shoulder AROM and begin parascapular strengthening   Bradly Chris PT, DPT  Daneil Dan, PT 06/21/2021, 11:53 AM

## 2021-06-25 ENCOUNTER — Ambulatory Visit: Payer: Medicare HMO | Admitting: Physical Therapy

## 2021-06-25 ENCOUNTER — Encounter: Payer: Self-pay | Admitting: Physical Therapy

## 2021-06-25 DIAGNOSIS — M25511 Pain in right shoulder: Secondary | ICD-10-CM | POA: Diagnosis not present

## 2021-06-25 DIAGNOSIS — M6281 Muscle weakness (generalized): Secondary | ICD-10-CM

## 2021-06-25 DIAGNOSIS — M75101 Unspecified rotator cuff tear or rupture of right shoulder, not specified as traumatic: Secondary | ICD-10-CM

## 2021-06-25 NOTE — Therapy (Signed)
OUTPATIENT PHYSICAL THERAPY TREATMENT NOTE   Patient Name: Christian Gordon MRN: [id number] DOB:08/10/1945, 76 y.o., male Today's Date: 06/25/2021  PCP: Dr. Waunita Schooner  REFERRING PROVIDER: Dr. Frederico Hamman Copland   END OF SESSION:   PT End of Session - 06/25/21 1025     Visit Number 4    Number of Visits 16    Date for PT Re-Evaluation 07/30/21    PT Start Time 1015    PT Stop Time 1100    PT Time Calculation (min) 45 min    Activity Tolerance Patient limited by pain    Behavior During Therapy [facility] for tasks assessed/performed             Past Medical History:  Diagnosis Date   Deaf, right    Dizzinesses    Hyperlipidemia    Hypertension    Prostate cancer ([place])    SOB (shortness of breath)    Past Surgical History:  Procedure Laterality Date   DENTAL SURGERY     Patient Active Problem List   Diagnosis Date Noted   Right leg swelling 05/03/2020   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   Bunion of right foot 10/06/2019   Pruritus 10/06/2019   Liver lesion 07/22/2019   Atherosclerosis 07/22/2019   Carotid artery stenosis 06/28/2019   History of prostate cancer 06/28/2019   Hyperlipidemia 06/28/2019   Hypertension 06/28/2019   Deafness 06/28/2019   COPD (chronic obstructive pulmonary disease) ([place]) 06/28/2019   History of tobacco use 06/28/2019    REFERRING DIAG:   M25.511,G89.11 (ICD-10-CM) - Acute pain of right shoulder due to trauma R29.898 (ICD-10-CM) - Shoulder weakness M24.811 (ICD-10-CM) - Internal derangement of right shoulder M54.9,G89.29 (ICD-10-CM) - Chronic back pain greater than 3 months duration  THERAPY DIAG:  Acute pain of right shoulder  Muscle weakness (generalized)  Nontraumatic tear of right rotator cuff, unspecified tear extent  Rationale for Evaluation and Treatment Rehabilitation  PERTINENT HISTORY: Per Dr. Lillie Fragmin Note on  05/28/21   Christian Gordon is a 76 y.o. very pleasant male patient with Body mass index  is 26.67 kg/m. who presents with the following:   Pleasant gentleman and roughly 1 months ago he was lifting a 35 pound bag and threw his right arm up and then sustained an acute injury.  At that point he had immediate pain and since then he has had extreme difficulty abducting the shoulder.  He has pain essentially all of the time.  He has a deep dull ache in the shoulder and in and about the upper arm and some into the shoulder blade.  He denies any radicular pain denies any neck pain.  No prior operative intervention, major fracture, or dislocation in the affected shoulder.   He also has some ongoing intermittent back pain in the lumbar spine without any form of radiculopathy.  No prior operations, major injury, and he does have a deep dull ache localized to the low back without any radiation to any other part of the back or buttocks or legs.  No numbness or tingling.  PRECAUTIONS: None   SUBJECTIVE: Pt reports increased pain after sessions to point where he needs to take a break for at least two days.    PAIN:  Are you having pain? Yes: NPRS scale: 6 to 7/10 Pain location: Right Shoulder AC joint and anterior of GH joint  Pain description: Achy  Aggravating factors: Lifting overhead  Relieving factors: Keeping his shoulder by his side.   OBJECTIVE: (objective measures  completed at initial evaluation unless otherwise dated)              VITALS: BP 152/74 SpO2 96 HR 76    DIAGNOSTIC FINDINGS:  None    PATIENT SURVEYS:  FOTO 51/66   COGNITION:           Overall cognitive status: Within functional limits for tasks assessed                                  SENSATION: WFL   POSTURE: Forward Head and Rounded Shoulders    UPPER EXTREMITY ROM:    Active ROM Right 06/05/2021 Left 06/05/2021  Shoulder flexion 45* 160  Shoulder extension 60 60  Shoulder abduction 45* 160  Shoulder adduction      Shoulder internal rotation 70 70  Shoulder external rotation 90 90  Elbow flexion  150 150  Elbow extension 0 0  Wrist flexion      Wrist extension      Wrist ulnar deviation      Wrist radial deviation      Wrist pronation      Wrist supination      (Blank rows = not tested)    PROM Right 06/05/2021 Left 06/05/2021  Shoulder flexion 160 160  Shoulder extension 60 60  Shoulder abduction 160 160  Shoulder adduction      Shoulder internal rotation 70 70  Shoulder external rotation 90 90  Elbow flexion 150 150  Elbow extension 0 0  Wrist flexion      Wrist extension      Wrist ulnar deviation      Wrist radial deviation      Wrist pronation      Wrist supination        (Blank rows = not tested)   UPPER EXTREMITY MMT:   MMT Right 06/05/2021 Left 06/05/2021  Shoulder flexion 2+* 4  Shoulder extension 5 5  Shoulder abduction 2+* 4  Shoulder adduction      Shoulder internal rotation 4 5  Shoulder external rotation 3 5  Middle trapezius      Lower trapezius      Elbow flexion 5 5  Elbow extension 5 5  Wrist flexion 5 5  Wrist extension 5 5  Wrist ulnar deviation      Wrist radial deviation      Wrist pronation      Wrist supination      Grip strength (lbs) Good  Good   (Blank rows = not tested)          SHOULDER SPECIAL TESTS:            Impingement tests: Neer impingement test: positive  and Painful arc test: positive  on RUE             SLAP lesions:  Not tested             Instability tests:  Not tested             Rotator cuff assessment: Drop arm test: positive , Full can test: positive , Belly press test: negative , and Infraspinatus test: positive             Biceps assessment: Speed's test: negative   JOINT MOBILITY TESTING:  Deferred to next visit    PALPATION:  Right anterior shoulder tender to palpation  TODAY'S TREATMENT:   06/25/21 UBE Seat 11 and resistance 1 for 5 min  Shoulder AAROM Abduction/Adduction 3 X 10 Shoulder AAROM Flexion/Extension 3 x 10   Supine Shoulder Dynamic Stabilization at 90 deg flexion and  abduction clockwise circles 30 x  Supine Shoulder Dynamic Stabilization at 90 deg flexion and abduction counter clockwise circles 30 x  Supine Shoulder Dynamic Stabilization Abduction and Adduction at 90 x abduction and flexion x 5  -Pt terminated due to pain  Supine Shoulder Dynamic Stabilization Abduction and Adduction at 90 x flexion and extension x 5  -Pt terminated due to pain   06/21/21 UBE Seat 11 and resistance 3 for 5 min   OMEGA Seated Scap Rows #25 3 x 10  -Pt experiences increased pain reaching forward with RUE   OMEGA Chest Press #5 2 x 10  -Pt reports increased right arm pain and needs to terminate activity   Push-Up with Plus 2 x 10  -Increased right arm pain   06/13/21  THEREX:  UBE Seat 11 and resistance 3 for 5 min  Scapular Rows with Green TB 1 x 10  Scapular Rows with Gray TB 3 x 10  Shoulder ER at 0 deg abduction Isometric hold with 3 sec hold 2 x 8  Shoulder IR at 0 deg abduction with YTB 3 x 10   MANUAL THERAPY:  Upper trap stretch  Suboccipital release  Right upper trap trigger point release     Initial Eval:  Shoulder AAROM pulleys flexion and extension 3 x 10 Shoulder AAROM pulleys abduction and adduction 3 x 10  Upper Trap Stretch 2 x 30 sec  -min VC for sequence and hand placement for exercise       PATIENT EDUCATION: Education details: Plan of care and appropriate form and technique for exercise  Person educated: Patient Education method: Explanation, Demonstration, Tactile cues, Verbal cues, and Handouts Education comprehension: verbalized understanding, returned demonstration, and verbal cues required     HOME EXERCISE PROGRAM: Access Code: [email or web address] URL: [email or web address] Date: 06/21/2021 Prepared by: Bradly Chris  Exercises - Seated Upper Trapezius Stretch  - 1 x daily - 7 x weekly - 1 sets - 3 reps - 60 hold - Seated Shoulder Flexion AAROM with Pulley Behind  - 1 x daily - 7 x weekly - 3 sets - 10 reps -  Seated Shoulder Row with Anchored Resistance  - 1 x daily - 3 x weekly - 3 sets - 10 reps - 1 hold - Isometric Shoulder External Rotation at Wall  - 1 x daily - 3 x weekly - 2 sets - 10 reps - 3 hold - Isometric Shoulder Flexion at Wall  - 1 x daily - 3 x weekly - 2 sets - 10 reps - 3 hold - Wall Push Up with Plus  - 1 x daily - 3 x weekly - 3 sets - 10 reps - Standing Isometric Shoulder Internal Rotation at Doorway  - 1 x daily - 3 x weekly - 2 sets - 10 reps - 3 hold   ASSESSMENT:   CLINICAL IMPRESSION:  Pt continues to be limited by right shoulder pain when flexing or extending arm between 60 degrees to 90 degrees. He must initially use left arm for AAROM for flexion to lift right shoulder into 45 degrees flexion indicating severe weakness with rotator cuff. Given pt's limited progress and limitations due to pain, he is nearing end of rehab potential and will likely require further medical evaluation and intervention  to improve right shoulder rom and strength.       OBJECTIVE IMPAIRMENTS decreased ROM, decreased strength, impaired UE functional use, and pain.    ACTIVITY LIMITATIONS driving, laundry, yard work, shopping, and yard work.    PERSONAL FACTORS Age and 3+ comorbidities: HTN, HLD, and h/o prostate cancer   are also affecting patient's functional outcome.      REHAB POTENTIAL: Fair severe decrease in left shoulder AROM indicating severe RTC tear    CLINICAL DECISION MAKING: Stable/uncomplicated   EVALUATION COMPLEXITY: Low     GOALS: Goals reviewed with patient? No   SHORT TERM GOALS: Target date: 06/19/2021  (Remove Blue Hyperlink)   Pt will be independent with HEP in order to improve strength and balance in order to decrease fall risk and improve function at home and work.   Baseline: NT  Goal status: INITIAL   2.  Pt will improve right shoulder AROM to 90 deg flexion and abduction to return functional capacity of RUE for overhead activity.  Baseline: Shoulder  Flexion R 45, Shoulder Abduction R 45  Goal status: INITIAL       LONG TERM GOALS: Target date: 07/31/2021  (Remove Blue Hyperlink)   Patient will have improved function and activity level as evidenced by an increase in FOTO score by 10 points or more.  Baseline: 51 Goal status: INITIAL   2.  Patient will improve right shoulder strength to be close to symmetrical to left shoulder to better be able to perform functional UE tasks such as lifting things like tools to prune bushes or place garbage in trash can.   Baseline: Shoulder Flexion R/L 2+/4, Shoulder Abduction R/L 2+/4 Goal status: INITIAL       PLAN: PT FREQUENCY: 1-2x/week   PT DURATION: 8 weeks   PLANNED INTERVENTIONS: Therapeutic exercises, Therapeutic activity, Neuromuscular re-education, Balance training, Gait training, Patient/Family education, Joint manipulation, Joint mobilization, Dry Needling, Electrical stimulation, Cryotherapy, Moist heat, and Manual therapy   PLAN FOR NEXT SESSION: Assess joint play and progress shoulder AROM and begin parascapular strengthening. [place]    Bradly Chris PT, DPT  Daneil Dan, PT 06/25/2021, 10:27 AM

## 2021-06-27 ENCOUNTER — Encounter: Payer: Self-pay | Admitting: Physical Therapy

## 2021-06-27 ENCOUNTER — Telehealth: Payer: Self-pay | Admitting: Family Medicine

## 2021-06-27 ENCOUNTER — Ambulatory Visit: Payer: Medicare HMO | Admitting: Physical Therapy

## 2021-06-27 DIAGNOSIS — M25511 Pain in right shoulder: Secondary | ICD-10-CM

## 2021-06-27 DIAGNOSIS — M6281 Muscle weakness (generalized): Secondary | ICD-10-CM

## 2021-06-27 DIAGNOSIS — M75101 Unspecified rotator cuff tear or rupture of right shoulder, not specified as traumatic: Secondary | ICD-10-CM

## 2021-06-27 NOTE — Therapy (Signed)
OUTPATIENT PHYSICAL THERAPY TREATMENT NOTE   Patient Name: Christian Gordon MRN: [id number] DOB:[birth year], 76 y.o., male Today's Date: 06/27/2021  PCP: Dr. Waunita Schooner  REFERRING PROVIDER: Dr. Frederico Hamman Copland   END OF SESSION:   PT End of Session - 06/27/21 1508     Visit Number 5    Number of Visits 16    Date for PT Re-Evaluation 07/30/21    PT Start Time 1505    PT Stop Time 1545    PT Time Calculation (min) 40 min    Activity Tolerance Patient limited by pain    Behavior During Therapy [facility] for tasks assessed/performed             Past Medical History:  Diagnosis Date   Deaf, right    Dizzinesses    Hyperlipidemia    Hypertension    Prostate cancer ([place])    SOB (shortness of breath)    Past Surgical History:  Procedure Laterality Date   DENTAL SURGERY     Patient Active Problem List   Diagnosis Date Noted   Right leg swelling 05/03/2020   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   Bunion of right foot 10/06/2019   Pruritus 10/06/2019   Liver lesion 07/22/2019   Atherosclerosis 07/22/2019   Carotid artery stenosis 06/28/2019   History of prostate cancer 06/28/2019   Hyperlipidemia 06/28/2019   Hypertension 06/28/2019   Deafness 06/28/2019   COPD (chronic obstructive pulmonary disease) ([place]) 06/28/2019   History of tobacco use 06/28/2019    REFERRING DIAG:   M25.511,G89.11 (ICD-10-CM) - Acute pain of right shoulder due to trauma R29.898 (ICD-10-CM) - Shoulder weakness M24.811 (ICD-10-CM) - Internal derangement of right shoulder M54.9,G89.29 (ICD-10-CM) - Chronic back pain greater than 3 months duration  THERAPY DIAG:  Acute pain of right shoulder  Nontraumatic tear of right rotator cuff, unspecified tear extent  Muscle weakness (generalized)  Rationale for Evaluation and Treatment Rehabilitation  PERTINENT HISTORY: Per Dr. Lillie Fragmin Note on  05/28/21   Christian Gordon is a 76 y.o. very pleasant male patient with Body mass index  is 26.67 kg/m. who presents with the following:   Pleasant gentleman and roughly 1 months ago he was lifting a 35 pound bag and threw his right arm up and then sustained an acute injury.  At that point he had immediate pain and since then he has had extreme difficulty abducting the shoulder.  He has pain essentially all of the time.  He has a deep dull ache in the shoulder and in and about the upper arm and some into the shoulder blade.  He denies any radicular pain denies any neck pain.  No prior operative intervention, major fracture, or dislocation in the affected shoulder.   He also has some ongoing intermittent back pain in the lumbar spine without any form of radiculopathy.  No prior operations, major injury, and he does have a deep dull ache localized to the low back without any radiation to any other part of the back or buttocks or legs.  No numbness or tingling.  PRECAUTIONS: None   SUBJECTIVE: Pt reports increased pain after sessions to point where he needs to take a break for at least two days.    PAIN:  Are you having pain? Yes: NPRS scale: 6 to 7/10 Pain location: Right Shoulder AC joint and anterior of GH joint  Pain description: Achy  Aggravating factors: Lifting overhead  Relieving factors: Keeping his shoulder by his side.   OBJECTIVE: (objective measures  completed at initial evaluation unless otherwise dated)              VITALS: BP 152/74 SpO2 96 HR 76    DIAGNOSTIC FINDINGS:  None    PATIENT SURVEYS:  FOTO 51/66   COGNITION:           Overall cognitive status: Within functional limits for tasks assessed                                  SENSATION: WFL   POSTURE: Forward Head and Rounded Shoulders    UPPER EXTREMITY ROM:    Active ROM Right 06/05/2021 Left 06/05/2021  Shoulder flexion 45* 160  Shoulder extension 60 60  Shoulder abduction 45* 160  Shoulder adduction      Shoulder internal rotation 70 70  Shoulder external rotation 90 90  Elbow flexion  150 150  Elbow extension 0 0  Wrist flexion      Wrist extension      Wrist ulnar deviation      Wrist radial deviation      Wrist pronation      Wrist supination      (Blank rows = not tested)    PROM Right 06/05/2021 Left 06/05/2021  Shoulder flexion 160 160  Shoulder extension 60 60  Shoulder abduction 160 160  Shoulder adduction      Shoulder internal rotation 70 70  Shoulder external rotation 90 90  Elbow flexion 150 150  Elbow extension 0 0  Wrist flexion      Wrist extension      Wrist ulnar deviation      Wrist radial deviation      Wrist pronation      Wrist supination        (Blank rows = not tested)   UPPER EXTREMITY MMT:   MMT Right 06/05/2021 Left 06/05/2021  Shoulder flexion 2+* 4  Shoulder extension 5 5  Shoulder abduction 2+* 4  Shoulder adduction      Shoulder internal rotation 4 5  Shoulder external rotation 3 5  Middle trapezius      Lower trapezius      Elbow flexion 5 5  Elbow extension 5 5  Wrist flexion 5 5  Wrist extension 5 5  Wrist ulnar deviation      Wrist radial deviation      Wrist pronation      Wrist supination      Grip strength (lbs) Good  Good   (Blank rows = not tested)          SHOULDER SPECIAL TESTS:            Impingement tests: Neer impingement test: positive  and Painful arc test: positive  on RUE             SLAP lesions:  Not tested             Instability tests:  Not tested             Rotator cuff assessment: Drop arm test: positive , Full can test: positive , Belly press test: negative , and Infraspinatus test: positive             Biceps assessment: Speed's test: negative   JOINT MOBILITY TESTING:  Deferred to next visit    PALPATION:  Right anterior shoulder tender to palpation  TODAY'S TREATMENT:  06/27/21  UBE Seat 10 and resistance level 2  Upper Trap Stretch on Right and Left 6 x 30 sec  Seated Rows with Grey TB 3 x 10  Shoulder IR/ER at 0 deg abduction with Body Blade for 2 x 30 sec   Shoulder IR/ER at 0 deg abduction with YTB 2 x 8  Shoulder IR/ER at 0 deg abduction with #1 DB with RUE 2 x 10  -min VC to maintain arm against side wall of body and to avoid lumbar rotation  Shoulder Flexion AROM R/L 60/60  Shoulder Abduction AROM R/L 60/60   06/25/21 UBE Seat 11 and resistance 1 for 5 min  Shoulder AAROM Abduction/Adduction 3 X 10 Shoulder AAROM Flexion/Extension 3 x 10   Supine Shoulder Dynamic Stabilization at 90 deg flexion and abduction clockwise circles 30 x  Supine Shoulder Dynamic Stabilization at 90 deg flexion and abduction counter clockwise circles 30 x  Supine Shoulder Dynamic Stabilization Abduction and Adduction at 90 x abduction and flexion x 5  -Pt terminated due to pain  Supine Shoulder Dynamic Stabilization Abduction and Adduction at 90 x flexion and extension x 5  -Pt terminated due to pain   06/21/21 UBE Seat 11 and resistance 3 for 5 min   OMEGA Seated Scap Rows #25 3 x 10  -Pt experiences increased pain reaching forward with [place] Chest Press #5 2 x 10  -Pt reports increased right arm pain and needs to terminate activity   Push-Up with Plus 2 x 10  -Increased right arm pain   06/13/21  THEREX:  UBE Seat 11 and resistance 3 for 5 min  Scapular Rows with Green TB 1 x 10  Scapular Rows with Gray TB 3 x 10  Shoulder ER at 0 deg abduction Isometric hold with 3 sec hold 2 x 8  Shoulder IR at 0 deg abduction with YTB 3 x 10   MANUAL THERAPY:  Upper trap stretch  Suboccipital release  Right upper trap trigger point release     Initial Eval:  Shoulder AAROM pulleys flexion and extension 3 x 10 Shoulder AAROM pulleys abduction and adduction 3 x 10  Upper Trap Stretch 2 x 30 sec  -min VC for sequence and hand placement for exercise       PATIENT EDUCATION: Education details: Plan of care and appropriate form and technique for exercise  Person educated: Patient Education method: Explanation, Demonstration, Tactile cues, Verbal  cues, and Handouts Education comprehension: verbalized understanding, returned demonstration, and verbal cues required     HOME EXERCISE PROGRAM: Access Code: [email or web address] URL: [email or web address] Date: 06/27/2021 Prepared by: Bradly Chris  Exercises - Seated Upper Trapezius Stretch  - 1 x daily - 7 x weekly - 1 sets - 3 reps - 60 hold - Seated Shoulder Flexion AAROM with Pulley Behind  - 1 x daily - 7 x weekly - 3 sets - 10 reps - Seated Shoulder Row with Anchored Resistance  - 1 x daily - 3 x weekly - 3 sets - 10 reps - 1 hold - Isometric Shoulder Flexion at Wall  - 1 x daily - 3 x weekly - 2 sets - 10 reps - 3 hold - Wall Push Up with Plus  - 1 x daily - 3 x weekly - 3 sets - 10 reps - Shoulder External Rotation with Anchored Resistance  - 1 x daily - 3 x weekly - 3 sets - 10 reps   ASSESSMENT:  CLINICAL IMPRESSION: Pt continues to show limited ROM with right shoulder along with pain. He does show a slight improvement in right shoulder flexion and abduction AROM, but still unable to reach 90 degrees. Given extent of right shoulder weakness likely from infraspinatus and supraspinatus tear, pt is likely close to maximizing rehab potential. Decreasing frequency to 1x a week to focus on continued parascapular and RTC strengthening, and PT will reach out to referring provider for additional follow of PT for ongoing evaluation and treatment.         OBJECTIVE IMPAIRMENTS decreased ROM, decreased strength, impaired UE functional use, and pain.    ACTIVITY LIMITATIONS driving, laundry, yard work, shopping, and yard work.    PERSONAL FACTORS Age and 3+ comorbidities: HTN, HLD, and h/o prostate cancer   are also affecting patient's functional outcome.      REHAB POTENTIAL: Fair severe decrease in left shoulder AROM indicating severe RTC tear    CLINICAL DECISION MAKING: Stable/uncomplicated   EVALUATION COMPLEXITY: Low     GOALS: Goals reviewed with patient? No    SHORT TERM GOALS: Target date: 06/19/2021  (Remove Blue Hyperlink)   Pt will be independent with HEP in order to improve strength and balance in order to decrease fall risk and improve function at home and work.   Baseline: NT  Goal status: ACHIEVED    2.  Pt will improve right shoulder AROM to 90 deg flexion and abduction to return functional capacity of RUE for overhead activity.  Baseline: Shoulder Flexion R 45, Shoulder Abduction R 45 06/27/21: Shoulder Flexion R 60, Shoulder Abduction R 60  Goal status: PARTIALLY MET        LONG TERM GOALS: Target date: 07/31/2021  (Remove Blue Hyperlink)   Patient will have improved function and activity level as evidenced by an increase in FOTO score by 10 points or more.  Baseline: 51 Goal status: INITIAL   2.  Patient will improve right shoulder strength to be close to symmetrical to left shoulder to better be able to perform functional UE tasks such as lifting things like tools to prune bushes or place garbage in trash can.   Baseline: Shoulder Flexion R/L 2+/4, Shoulder Abduction R/L 2+/4 Goal status: INITIAL       PLAN: PT FREQUENCY: 1-2x/week   PT DURATION: 8 weeks   PLANNED INTERVENTIONS: Therapeutic exercises, Therapeutic activity, Neuromuscular re-education, Balance training, Gait training, Patient/Family education, Joint manipulation, Joint mobilization, Dry Needling, Electrical stimulation, Cryotherapy, Moist heat, and Manual therapy   PLAN FOR NEXT SESSION: Ongoing RTC and parascapular strengthening   Bradly Chris PT, DPT  Daneil Dan, PT 06/27/2021, 3:10 PM

## 2021-06-27 NOTE — Telephone Encounter (Signed)
Can we make sure that he has follow-up with me in the next couple of weeks?  Let him know that I got a message from his PT that he was not doing great.

## 2021-06-27 NOTE — Telephone Encounter (Signed)
Spoke with Mr. Phariss and scheduled follow up with Dr. Lorelei Pont 07/05/2021 at 3:20 pm

## 2021-07-02 ENCOUNTER — Ambulatory Visit: Payer: Medicare HMO | Admitting: Physical Therapy

## 2021-07-02 ENCOUNTER — Encounter: Payer: Self-pay | Admitting: Physical Therapy

## 2021-07-02 DIAGNOSIS — M75101 Unspecified rotator cuff tear or rupture of right shoulder, not specified as traumatic: Secondary | ICD-10-CM

## 2021-07-02 DIAGNOSIS — M25511 Pain in right shoulder: Secondary | ICD-10-CM

## 2021-07-02 NOTE — Therapy (Signed)
OUTPATIENT PHYSICAL THERAPY TREATMENT NOTE   Patient Name: Christian Gordon MRN: [id number] DOB:[birth year], 76 y.o., male Today's Date: 07/02/2021  PCP: Dr. Waunita Schooner  REFERRING PROVIDER: Dr. Frederico Hamman Copland   END OF SESSION:   PT End of Session - 07/02/21 1020     Visit Number 6    Number of Visits 16    Date for PT Re-Evaluation 07/30/21    PT Start Time 1015    PT Stop Time 1100    PT Time Calculation (min) 45 min    Activity Tolerance Patient limited by pain    Behavior During Therapy [facility] for tasks assessed/performed             Past Medical History:  Diagnosis Date   Deaf, right    Dizzinesses    Hyperlipidemia    Hypertension    Prostate cancer ([place])    SOB (shortness of breath)    Past Surgical History:  Procedure Laterality Date   DENTAL SURGERY     Patient Active Problem List   Diagnosis Date Noted   Right leg swelling 05/03/2020   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   Bunion of right foot 10/06/2019   Pruritus 10/06/2019   Liver lesion 07/22/2019   Atherosclerosis 07/22/2019   Carotid artery stenosis 06/28/2019   History of prostate cancer 06/28/2019   Hyperlipidemia 06/28/2019   Hypertension 06/28/2019   Deafness 06/28/2019   COPD (chronic obstructive pulmonary disease) ([place]) 06/28/2019   History of tobacco use 06/28/2019    REFERRING DIAG:   M25.511,G89.11 (ICD-10-CM) - Acute pain of right shoulder due to trauma R29.898 (ICD-10-CM) - Shoulder weakness M24.811 (ICD-10-CM) - Internal derangement of right shoulder M54.9,G89.29 (ICD-10-CM) - Chronic back pain greater than 3 months duration  THERAPY DIAG:  Acute pain of right shoulder  Nontraumatic tear of right rotator cuff, unspecified tear extent  Rationale for Evaluation and Treatment Rehabilitation  PERTINENT HISTORY: Per Dr. Lillie Fragmin Note on  05/28/21   Christian Gordon is a 76 y.o. very pleasant male patient with Body mass index is 26.67 kg/m. who presents  with the following:   Pleasant gentleman and roughly 1 months ago he was lifting a 35 pound bag and threw his right arm up and then sustained an acute injury.  At that point he had immediate pain and since then he has had extreme difficulty abducting the shoulder.  He has pain essentially all of the time.  He has a deep dull ache in the shoulder and in and about the upper arm and some into the shoulder blade.  He denies any radicular pain denies any neck pain.  No prior operative intervention, major fracture, or dislocation in the affected shoulder.   He also has some ongoing intermittent back pain in the lumbar spine without any form of radiculopathy.  No prior operations, major injury, and he does have a deep dull ache localized to the low back without any radiation to any other part of the back or buttocks or legs.  No numbness or tingling.  PRECAUTIONS: None   SUBJECTIVE: Pt reports that after attempting to do ER RTC exercise he was able to do 10 but then after stopping could not do more. He states that he has an apt with orthopedist this Thursday.    PAIN:  Are you having pain? Yes: NPRS scale: 6 to 7/10 Pain location: Right Shoulder AC joint and anterior of GH joint  Pain description: Achy  Aggravating factors: Lifting overhead  Relieving factors:  Keeping his shoulder by his side.   OBJECTIVE: (objective measures completed at initial evaluation unless otherwise dated)              VITALS: BP 152/74 SpO2 96 HR 76    DIAGNOSTIC FINDINGS:  None    PATIENT SURVEYS:  FOTO 51/66   COGNITION:           Overall cognitive status: Within functional limits for tasks assessed                                  SENSATION: WFL   POSTURE: Forward Head and Rounded Shoulders    UPPER EXTREMITY ROM:    Active ROM Right 06/05/2021 Left 06/05/2021  Shoulder flexion 45* 160  Shoulder extension 60 60  Shoulder abduction 45* 160  Shoulder adduction      Shoulder internal rotation 70 70   Shoulder external rotation 90 90  Elbow flexion 150 150  Elbow extension 0 0  Wrist flexion      Wrist extension      Wrist ulnar deviation      Wrist radial deviation      Wrist pronation      Wrist supination      (Blank rows = not tested)    PROM Right 06/05/2021 Left 06/05/2021  Shoulder flexion 160 160  Shoulder extension 60 60  Shoulder abduction 160 160  Shoulder adduction      Shoulder internal rotation 70 70  Shoulder external rotation 90 90  Elbow flexion 150 150  Elbow extension 0 0  Wrist flexion      Wrist extension      Wrist ulnar deviation      Wrist radial deviation      Wrist pronation      Wrist supination        (Blank rows = not tested)   UPPER EXTREMITY MMT:   MMT Right 06/05/2021 Left 06/05/2021  Shoulder flexion 2+* 4  Shoulder extension 5 5  Shoulder abduction 2+* 4  Shoulder adduction      Shoulder internal rotation 4 5  Shoulder external rotation 3 5  Middle trapezius      Lower trapezius      Elbow flexion 5 5  Elbow extension 5 5  Wrist flexion 5 5  Wrist extension 5 5  Wrist ulnar deviation      Wrist radial deviation      Wrist pronation      Wrist supination      Grip strength (lbs) Good  Good   (Blank rows = not tested)          SHOULDER SPECIAL TESTS:            Impingement tests: Neer impingement test: positive  and Painful arc test: positive  on RUE             SLAP lesions:  Not tested             Instability tests:  Not tested             Rotator cuff assessment: Drop arm test: positive , Full can test: positive , Belly press test: negative , and Infraspinatus test: positive             Biceps assessment: Speed's test: negative   JOINT MOBILITY TESTING:  Deferred to next visit    PALPATION:  Right anterior shoulder tender to palpation  TODAY'S TREATMENT:  07/02/21 UBE Seat 8 and resistance level 2 for 7 min  Side Lying Hip ER #1 DB 1 x 5 -Pt unable to perform due to pain  AROM Left Shoulder  Abduction Towel Slide on 4 inch platform 1 x 10  AROM Left Shoulder Abduction Towel Slide on 6 inch platform 1 x 10  AROM Left Shoulder Abduction Towel Slide on 8 inch platform 1 x 10   AAROM Left Shoulder Flexion to 90 deg 1 x 10  Shoulder ER at 0 deg abduction with 5 sec hold 1 x 5                        Shoulder Flex at 0 deg flexion with 5 sec hold 1 x 10                       Shoulder AAROM Pulleys Abduction 3 x 10                      Shoulder AAROM Pulleys Flexion 3 x 10                      Wall Push Ups 1 x 10                      -mod VC to extend elbow and arms fully   06/27/21  UBE Seat 10 and resistance level 2  Upper Trap Stretch on Right and Left 6 x 30 sec  Seated Rows with Grey TB 3 x 10  Shoulder IR/ER at 0 deg abduction with Body Blade for 2 x 30 sec  Shoulder IR/ER at 0 deg abduction with YTB 2 x 8  Shoulder IR/ER at 0 deg abduction with #1 DB with RUE 2 x 10  -min VC to maintain arm against side wall of body and to avoid lumbar rotation  Shoulder Flexion AROM R/L 60/60  Shoulder Abduction AROM R/L 60/60   06/25/21 UBE Seat 11 and resistance 1 for 5 min  Shoulder AAROM Abduction/Adduction 3 X 10 Shoulder AAROM Flexion/Extension 3 x 10   Supine Shoulder Dynamic Stabilization at 90 deg flexion and abduction clockwise circles 30 x  Supine Shoulder Dynamic Stabilization at 90 deg flexion and abduction counter clockwise circles 30 x  Supine Shoulder Dynamic Stabilization Abduction and Adduction at 90 x abduction and flexion x 5  -Pt terminated due to pain  Supine Shoulder Dynamic Stabilization Abduction and Adduction at 90 x flexion and extension x 5  -Pt terminated due to pain      PATIENT EDUCATION: Education details: Plan of care and appropriate form and technique for exercise  Person educated: Patient Education method: Explanation, Demonstration, Tactile cues, Verbal cues, and Handouts Education comprehension: verbalized understanding, returned demonstration,  and verbal cues required     HOME EXERCISE PROGRAM: Access Code: [email or web address] URL: [email or web address] Date: 07/02/2021 Prepared by: Bradly Chris  Exercises - Seated Upper Trapezius Stretch  - 1 x daily - 7 x weekly - 1 sets - 3 reps - 60 hold - Seated Shoulder Flexion AAROM with Pulley Behind  - 1 x daily - 7 x weekly - 3 sets - 10 reps - Seated Shoulder Row with Anchored Resistance  - 1 x daily - 3 x weekly - 3 sets - 10 reps - 1 hold - Wall Push Up with Plus  - 1 x daily - 3  x weekly - 3 sets - 10 reps - Seated Shoulder Abduction AAROM with Pulley Behind  - 1 x daily - 7 x weekly - 3 sets - 10 reps   ASSESSMENT:   CLINICAL IMPRESSION:  Pt continues to be significantly limited by right shoulder pain with inability to do low level isometric strengthening exercises. He is able to complete AAROM exercises with some increase in pain, but it appears as if muscle activation is aggravating to shoulder pain. Modified exercises to exclude concentric shoulder strengthening exercises and only to include parascapular and ROM shoulder exercises. He is close to reaching full rehab potential and he will require further medical evaluation and treatment to determine further interventions.    OBJECTIVE IMPAIRMENTS decreased ROM, decreased strength, impaired UE functional use, and pain.    ACTIVITY LIMITATIONS driving, laundry, yard work, shopping, and yard work.    PERSONAL FACTORS Age and 3+ comorbidities: HTN, HLD, and h/o prostate cancer   are also affecting patient's functional outcome.      REHAB POTENTIAL: Fair severe decrease in left shoulder AROM indicating severe RTC tear    CLINICAL DECISION MAKING: Stable/uncomplicated   EVALUATION COMPLEXITY: Low     GOALS: Goals reviewed with patient? No   SHORT TERM GOALS: Target date: 06/19/2021  (Remove Blue Hyperlink)   Pt will be independent with HEP in order to improve strength and balance in order to decrease fall risk and  improve function at home and work.   Baseline: NT  Goal status: ACHIEVED    2.  Pt will improve right shoulder AROM to 90 deg flexion and abduction to return functional capacity of RUE for overhead activity.  Baseline: Shoulder Flexion R 45, Shoulder Abduction R 45 06/27/21: Shoulder Flexion R 60, Shoulder Abduction R 60  Goal status: PARTIALLY MET        LONG TERM GOALS: Target date: 07/31/2021  (Remove Blue Hyperlink)   Patient will have improved function and activity level as evidenced by an increase in FOTO score by 10 points or more.  Baseline: 51 Goal status: INITIAL   2.  Patient will improve right shoulder strength to be close to symmetrical to left shoulder to better be able to perform functional UE tasks such as lifting things like tools to prune bushes or place garbage in trash can.   Baseline: Shoulder Flexion R/L 2+/4, Shoulder Abduction R/L 2+/4 Goal status: INITIAL       PLAN: PT FREQUENCY: 1-2x/week   PT DURATION: 8 weeks   PLANNED INTERVENTIONS: Therapeutic exercises, Therapeutic activity, Neuromuscular re-education, Balance training, Gait training, Patient/Family education, Joint manipulation, Joint mobilization, Dry Needling, Electrical stimulation, Cryotherapy, Moist heat, and Manual therapy   PLAN FOR NEXT SESSION: Ongoing RTC and parascapular strengthening   Bradly Chris PT, DPT  Daneil Dan, PT 07/02/2021, 10:20 AM

## 2021-07-05 ENCOUNTER — Ambulatory Visit (INDEPENDENT_AMBULATORY_CARE_PROVIDER_SITE_OTHER): Payer: Medicare HMO | Admitting: Family Medicine

## 2021-07-05 ENCOUNTER — Encounter: Payer: Self-pay | Admitting: Family Medicine

## 2021-07-05 ENCOUNTER — Encounter: Payer: Medicare HMO | Admitting: Physical Therapy

## 2021-07-05 VITALS — BP 140/60 | HR 89 | Temp 98.3°F | Ht 68.0 in | Wt 173.4 lb

## 2021-07-05 DIAGNOSIS — H5789 Other specified disorders of eye and adnexa: Secondary | ICD-10-CM

## 2021-07-05 DIAGNOSIS — R29898 Other symptoms and signs involving the musculoskeletal system: Secondary | ICD-10-CM | POA: Diagnosis not present

## 2021-07-05 DIAGNOSIS — M24811 Other specific joint derangements of right shoulder, not elsewhere classified: Secondary | ICD-10-CM

## 2021-07-05 DIAGNOSIS — M25511 Pain in right shoulder: Secondary | ICD-10-CM

## 2021-07-05 DIAGNOSIS — G8911 Acute pain due to trauma: Secondary | ICD-10-CM

## 2021-07-05 MED ORDER — TRIAMCINOLONE ACETONIDE 40 MG/ML IJ SUSP
40.0000 mg | Freq: Once | INTRAMUSCULAR | Status: AC
  Administered 2021-07-05: 40 mg via INTRA_ARTICULAR

## 2021-07-05 MED ORDER — POLYMYXIN B-TRIMETHOPRIM 10000-0.1 UNIT/ML-% OP SOLN: 1.0000 [drp] | Freq: Four times a day (QID) | OPHTHALMIC | 0 refills | Status: AC

## 2021-07-05 NOTE — Progress Notes (Unsigned)
    Trasean Delima T. Aeden Matranga, MD, [place] at [facility] [place], [address]  Phone: [phone]  FAX: [phone]  Linc Renne - 76 y.o. male  MRN [id number]  Date of Birth: 1945/09/04  Date: 07/05/2021  PCP: [name], MD  Referral: [name], MD  Chief Complaint  Patient presents with   Follow-up    Right Shoulder   Subjective:   Christian Gordon is a 76 y.o. very pleasant male patient with Body mass index is 26.37 kg/m. who presents with the following:  6-week follow-up for right-sided shoulder pain.  R shoulder is not doing well.  Still having a lot of weakness.  Inject R shoulder.  Intra.  Eye contact from dog on the R eye - itching and uncomfortable  Rotator cuff tear, R  06/27/2021 Last OV with Owens Loffler, MD  Pleasant gentleman and roughly 1 months ago he was lifting a 35 pound bag and threw his right arm up and then sustained an acute injury.  At that point he had immediate pain and since then he has had extreme difficulty abducting the shoulder.  He has pain essentially all of the time.  He has a deep dull ache in the shoulder and in and about the upper arm and some into the shoulder blade.  He denies any radicular pain denies any neck pain.  No prior operative intervention, major fracture, or dislocation in the affected shoulder.  He also has some ongoing intermittent back pain in the lumbar spine without any form of radiculopathy.  No prior operations, major injury, and he does have a deep dull ache localized to the low back without any radiation to any other part of the back or buttocks or legs.  No numbness or tingling.    Review of Systems is noted in the HPI, as appropriate  Objective:   BP 140/60   Pulse 89   Temp 98.3 F (36.8 C) (Oral)   Ht '5\' 8"'$  (1.727 m)   Wt 173 lb 7 oz (78.7 kg)   SpO2 92%   BMI 26.37 kg/m   GEN: No acute distress; alert,appropriate. PULM:  Breathing comfortably in no respiratory distress PSYCH: Normally interactive.   Laboratory and Imaging Data:  Assessment and Plan:   ***

## 2021-07-10 ENCOUNTER — Telehealth: Payer: Self-pay | Admitting: Physical Therapy

## 2021-07-10 ENCOUNTER — Ambulatory Visit: Payer: Medicare HMO | Admitting: Physical Therapy

## 2021-07-10 ENCOUNTER — Encounter: Payer: Self-pay | Admitting: Physical Therapy

## 2021-07-10 DIAGNOSIS — M25511 Pain in right shoulder: Secondary | ICD-10-CM

## 2021-07-10 DIAGNOSIS — M75101 Unspecified rotator cuff tear or rupture of right shoulder, not specified as traumatic: Secondary | ICD-10-CM

## 2021-07-10 NOTE — Therapy (Signed)
OUTPATIENT PHYSICAL THERAPY TREATMENT NOTE   Patient Name: Christian Gordon MRN: [id number] DOB:March 29, 1945, 76 y.o., male Today's Date: 07/10/2021  PCP: Dr. Waunita Schooner  REFERRING PROVIDER: Dr. Frederico Hamman Copland   END OF SESSION:   PT End of Session - 07/10/21 1509     Visit Number 7    Number of Visits 16    Date for PT Re-Evaluation 07/30/21    PT Start Time 1505    PT Stop Time 1545    PT Time Calculation (min) 40 min    Activity Tolerance Patient limited by pain    Behavior During Therapy [facility] for tasks assessed/performed             Past Medical History:  Diagnosis Date   Deaf, right    Dizzinesses    Hyperlipidemia    Hypertension    Prostate cancer ([place])    SOB (shortness of breath)    Past Surgical History:  Procedure Laterality Date   DENTAL SURGERY     Patient Active Problem List   Diagnosis Date Noted   Right leg swelling 05/03/2020   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   Bunion of right foot 10/06/2019   Pruritus 10/06/2019   Liver lesion 07/22/2019   Atherosclerosis 07/22/2019   Carotid artery stenosis 06/28/2019   History of prostate cancer 06/28/2019   Hyperlipidemia 06/28/2019   Hypertension 06/28/2019   Deafness 06/28/2019   COPD (chronic obstructive pulmonary disease) ([place]) 06/28/2019   History of tobacco use 06/28/2019    REFERRING DIAG:   M25.511,G89.11 (ICD-10-CM) - Acute pain of right shoulder due to trauma R29.898 (ICD-10-CM) - Shoulder weakness M24.811 (ICD-10-CM) - Internal derangement of right shoulder M54.9,G89.29 (ICD-10-CM) - Chronic back pain greater than 3 months duration  THERAPY DIAG:  Acute pain of right shoulder  Nontraumatic tear of right rotator cuff, unspecified tear extent  Rationale for Evaluation and Treatment Rehabilitation  PERTINENT HISTORY: Per Dr. Lillie Fragmin Note on  05/28/21   Christian Gordon is a 76 y.o. very pleasant male patient with Body mass index is 26.67 kg/m. who presents  with the following:   Pleasant gentleman and roughly 1 months ago he was lifting a 35 pound bag and threw his right arm up and then sustained an acute injury.  At that point he had immediate pain and since then he has had extreme difficulty abducting the shoulder.  He has pain essentially all of the time.  He has a deep dull ache in the shoulder and in and about the upper arm and some into the shoulder blade.  He denies any radicular pain denies any neck pain.  No prior operative intervention, major fracture, or dislocation in the affected shoulder.   He also has some ongoing intermittent back pain in the lumbar spine without any form of radiculopathy.  No prior operations, major injury, and he does have a deep dull ache localized to the low back without any radiation to any other part of the back or buttocks or legs.  No numbness or tingling.  PRECAUTIONS: None   SUBJECTIVE: Pt reports that he received right shoulder joint injection and he feels mild relief. He has only been able to do pulleys and isometric exercises.    PAIN:  Are you having pain? Yes: NPRS scale: 6 to 7/10 Pain location: Right Shoulder AC joint and anterior of GH joint  Pain description: Achy  Aggravating factors: Lifting overhead  Relieving factors: Keeping his shoulder by his side.   OBJECTIVE: (objective  measures completed at initial evaluation unless otherwise dated)              VITALS: BP 152/74 SpO2 96 HR 76    DIAGNOSTIC FINDINGS:  None    PATIENT SURVEYS:  FOTO 54/66   COGNITION:           Overall cognitive status: Within functional limits for tasks assessed                                  SENSATION: WFL   POSTURE: Forward Head and Rounded Shoulders    UPPER EXTREMITY ROM:    Active ROM Right 06/05/2021 Left 06/05/2021  Shoulder flexion 45* 160  Shoulder extension 60 60  Shoulder abduction 45* 160  Shoulder adduction      Shoulder internal rotation 70 70  Shoulder external rotation 90 90   Elbow flexion 150 150  Elbow extension 0 0  Wrist flexion      Wrist extension      Wrist ulnar deviation      Wrist radial deviation      Wrist pronation      Wrist supination      (Blank rows = not tested)    PROM Right 06/05/2021 Left 06/05/2021  Shoulder flexion 160 160  Shoulder extension 60 60  Shoulder abduction 160 160  Shoulder adduction      Shoulder internal rotation 70 70  Shoulder external rotation 90 90  Elbow flexion 150 150  Elbow extension 0 0  Wrist flexion      Wrist extension      Wrist ulnar deviation      Wrist radial deviation      Wrist pronation      Wrist supination        (Blank rows = not tested)   UPPER EXTREMITY MMT:   MMT Right 06/05/2021 Left 06/05/2021  Shoulder flexion 2+* 4  Shoulder extension 5 5  Shoulder abduction 2+* 4  Shoulder adduction      Shoulder internal rotation 4 5  Shoulder external rotation 3 5  Middle trapezius      Lower trapezius      Elbow flexion 5 5  Elbow extension 5 5  Wrist flexion 5 5  Wrist extension 5 5  Wrist ulnar deviation      Wrist radial deviation      Wrist pronation      Wrist supination      Grip strength (lbs) Good  Good   (Blank rows = not tested)          SHOULDER SPECIAL TESTS:            Impingement tests: Neer impingement test: positive  and Painful arc test: positive  on RUE             SLAP lesions:  Not tested             Instability tests:  Not tested             Rotator cuff assessment: Drop arm test: positive , Full can test: positive , Belly press test: negative , and Infraspinatus test: positive             Biceps assessment: Speed's test: negative   JOINT MOBILITY TESTING:  Deferred to next visit    PALPATION:  Right anterior shoulder tender to palpation  TODAY'S TREATMENT:   07/10/21  UBE Seat 10 and resistance 2 for 6 min  Shoulder IR/ER #1 DB 3 x 10  -min VC to decrease magnitude of ER to avoid increasing RTC pain  Sky Punches 1 x 10  -NPS 6 to  7/10  Standing Rows with Grey TB 3 x 10  -min VC to decrease speed of eccentric  Lat Pull Downs with YTB  -min VC to decrease speed of eccentric    07/02/21 UBE Seat 8 and resistance level 2 for 7 min  Side Lying Hip ER #1 DB 1 x 5 -Pt unable to perform due to pain  AROM Left Shoulder Abduction Towel Slide on 4 inch platform 1 x 10  AROM Left Shoulder Abduction Towel Slide on 6 inch platform 1 x 10  AROM Left Shoulder Abduction Towel Slide on 8 inch platform 1 x 10   AAROM Left Shoulder Flexion to 90 deg 1 x 10  Shoulder ER at 0 deg abduction with 5 sec hold 1 x 5                        Shoulder Flex at 0 deg flexion with 5 sec hold 1 x 10                       Shoulder AAROM Pulleys Abduction 3 x 10                      Shoulder AAROM Pulleys Flexion 3 x 10                      Wall Push Ups 1 x 10                      -mod VC to extend elbow and arms fully   06/27/21  UBE Seat 10 and resistance level 2  Upper Trap Stretch on Right and Left 6 x 30 sec  Seated Rows with Grey TB 3 x 10  Shoulder IR/ER at 0 deg abduction with Body Blade for 2 x 30 sec  Shoulder IR/ER at 0 deg abduction with YTB 2 x 8  Shoulder IR/ER at 0 deg abduction with #1 DB with RUE 2 x 10  -min VC to maintain arm against side wall of body and to avoid lumbar rotation  Shoulder Flexion AROM R/L 60/60  Shoulder Abduction AROM R/L 60/60        PATIENT EDUCATION: Education details: Plan of care and appropriate form and technique for exercise  Person educated: Patient Education method: Explanation, Demonstration, Tactile cues, Verbal cues, and Handouts Education comprehension: verbalized understanding, returned demonstration, and verbal cues required     HOME EXERCISE PROGRAM: Access Code: [email or web address] URL: [email or web address] Date: 07/02/2021 Prepared by: Bradly Chris  Exercises - Seated Upper Trapezius Stretch  - 1 x daily - 7 x weekly - 1 sets - 3 reps - 60 hold - Seated Shoulder  Flexion AAROM with Pulley Behind  - 1 x daily - 7 x weekly - 3 sets - 10 reps - Seated Shoulder Row with Anchored Resistance  - 1 x daily - 3 x weekly - 3 sets - 10 reps - 1 hold - Wall Push Up with Plus  - 1 x daily - 3 x weekly - 3 sets - 10 reps - Seated Shoulder Abduction AAROM with Pulley Behind  - 1 x daily -  7 x weekly - 3 sets - 10 reps   ASSESSMENT:   CLINICAL IMPRESSION:  Pt continues to be limited by right shoulder pain especially with flexion and abduction > 90 deg. PT educated about rehab limitations given severity of likely RTC tear. He is near end of POC maximizing possible exercises given severity of pain response and limitations of ROM. Next session will likely be next session given limitations in structures of RTC.     OBJECTIVE IMPAIRMENTS decreased ROM, decreased strength, impaired UE functional use, and pain.    ACTIVITY LIMITATIONS driving, laundry, yard work, shopping, and yard work.    PERSONAL FACTORS Age and 3+ comorbidities: HTN, HLD, and h/o prostate cancer   are also affecting patient's functional outcome.      REHAB POTENTIAL: Fair severe decrease in left shoulder AROM indicating severe RTC tear    CLINICAL DECISION MAKING: Stable/uncomplicated   EVALUATION COMPLEXITY: Low     GOALS: Goals reviewed with patient? No   SHORT TERM GOALS: Target date: 06/19/2021  (Remove Blue Hyperlink)   Pt will be independent with HEP in order to improve strength and balance in order to decrease fall risk and improve function at home and work.   Baseline: NT  Goal status: ACHIEVED    2.  Pt will improve right shoulder AROM to 90 deg flexion and abduction to return functional capacity of RUE for overhead activity.  Baseline: Shoulder Flexion R 45, Shoulder Abduction R 45 06/27/21: Shoulder Flexion R 60, Shoulder Abduction R 60  Goal status: PARTIALLY MET        LONG TERM GOALS: Target date: 07/31/2021  (Remove Blue Hyperlink)   Patient will have improved function and  activity level as evidenced by an increase in FOTO score by 10 points or more.  Baseline: 51 07/10/21: 54 Goal status: INITIAL   2.  Patient will improve right shoulder strength to be close to symmetrical to left shoulder to better be able to perform functional UE tasks such as lifting things like tools to prune bushes or place garbage in trash can.   Baseline: Shoulder Flexion R/L 2+/4, Shoulder Abduction R/L 2+/4 Goal status: INITIAL       PLAN: PT FREQUENCY: 1-2x/week   PT DURATION: 8 weeks   PLANNED INTERVENTIONS: Therapeutic exercises, Therapeutic activity, Neuromuscular re-education, Balance training, Gait training, Patient/Family education, Joint manipulation, Joint mobilization, Dry Needling, Electrical stimulation, Cryotherapy, Moist heat, and Manual therapy   PLAN FOR NEXT SESSION: Finish HEP. Ongoing RTC and parascapular strengthening   Bradly Chris PT, DPT  Daneil Dan, PT 07/10/2021, 3:50 PM

## 2021-07-10 NOTE — Telephone Encounter (Signed)
Attempted to call pt to see if he was still coming into his apt. He did not pick up and could not leave VM.

## 2021-07-12 ENCOUNTER — Encounter: Payer: Medicare HMO | Admitting: Physical Therapy

## 2021-07-16 ENCOUNTER — Telehealth: Payer: Self-pay | Admitting: Family Medicine

## 2021-07-16 ENCOUNTER — Encounter: Payer: Self-pay | Admitting: Physical Therapy

## 2021-07-16 ENCOUNTER — Ambulatory Visit: Payer: Medicare HMO | Admitting: Physical Therapy

## 2021-07-16 DIAGNOSIS — M25511 Pain in right shoulder: Secondary | ICD-10-CM | POA: Diagnosis not present

## 2021-07-16 DIAGNOSIS — H5789 Other specified disorders of eye and adnexa: Secondary | ICD-10-CM

## 2021-07-16 DIAGNOSIS — M75101 Unspecified rotator cuff tear or rupture of right shoulder, not specified as traumatic: Secondary | ICD-10-CM

## 2021-07-16 NOTE — Telephone Encounter (Signed)
Pt called and said that Dr Patsy Lager had prescribed trimethoprim-polymyxin b (POLYTRIM) ophthalmic solution  for his eye and he said it didn't help at all and he wanted to know if he can get a referral to an eye specialist. Please advise. Call back is [phone]

## 2021-07-19 ENCOUNTER — Encounter: Payer: Medicare HMO | Admitting: Physical Therapy

## 2021-07-27 NOTE — Telephone Encounter (Signed)
Noted  

## 2021-08-09 ENCOUNTER — Ambulatory Visit (INDEPENDENT_AMBULATORY_CARE_PROVIDER_SITE_OTHER): Payer: Medicare HMO | Admitting: Podiatry

## 2021-08-09 DIAGNOSIS — M79675 Pain in left toe(s): Secondary | ICD-10-CM

## 2021-08-09 DIAGNOSIS — M79674 Pain in right toe(s): Secondary | ICD-10-CM

## 2021-08-09 DIAGNOSIS — M2011 Hallux valgus (acquired), right foot: Secondary | ICD-10-CM

## 2021-08-09 DIAGNOSIS — B351 Tinea unguium: Secondary | ICD-10-CM

## 2021-08-09 NOTE — Progress Notes (Signed)
This patient returns to the office for evaluation and treatment of long thick painful nails .  This patient is unable to trim his own nails since the patient cannot reach his feet.  Patient says the nails are painful walking and wearing his shoes.  He returns for preventive foot care services.  General Appearance  Alert, conversant and in no acute stress.  Vascular  Dorsalis pedis and posterior tibial  pulses are palpable  bilaterally.  Capillary return is within normal limits  bilaterally. Temperature is within normal limits  bilaterally.  Neurologic  Senn-Weinstein monofilament wire test within normal limits  bilaterally. Muscle power within normal limits bilaterally.  Nails Thick disfigured discolored nails with subungual debris  from second to fifth toes bilaterally. No evidence of bacterial infection or drainage bilaterally.  Orthopedic  No limitations of motion  feet .  No crepitus or effusions noted.  No bony pathology or digital deformities noted.  HAV right foot.  Hammer toes  B/L.  Skin  normotropic skin with no porokeratosis noted bilaterally.  No signs of infections or ulcers noted.     Onychomycosis  Pain in toes right foot  Pain in toes left foot  Debridement  of nails  2-5  B/L with a nail nipper.  Nails were then filed using a dremel tool with no incidents.  Patient requests pads for his 4th toe HT right foot.  RTC  3 months   Gardiner Barefoot DPM

## 2021-08-29 ENCOUNTER — Other Ambulatory Visit: Payer: Self-pay | Admitting: Family Medicine

## 2021-08-29 DIAGNOSIS — Z8546 Personal history of malignant neoplasm of prostate: Secondary | ICD-10-CM

## 2021-08-31 ENCOUNTER — Ambulatory Visit (INDEPENDENT_AMBULATORY_CARE_PROVIDER_SITE_OTHER): Payer: Medicare HMO | Admitting: Family Medicine

## 2021-08-31 ENCOUNTER — Encounter: Payer: Self-pay | Admitting: Family Medicine

## 2021-08-31 VITALS — BP 98/50 | HR 83 | Temp 97.8°F | Ht 68.0 in | Wt 171.2 lb

## 2021-08-31 DIAGNOSIS — I10 Essential (primary) hypertension: Secondary | ICD-10-CM

## 2021-08-31 DIAGNOSIS — E782 Mixed hyperlipidemia: Secondary | ICD-10-CM | POA: Diagnosis not present

## 2021-08-31 DIAGNOSIS — Z8546 Personal history of malignant neoplasm of prostate: Secondary | ICD-10-CM | POA: Diagnosis not present

## 2021-08-31 DIAGNOSIS — Z0000 Encounter for general adult medical examination without abnormal findings: Secondary | ICD-10-CM

## 2021-08-31 DIAGNOSIS — Z720 Tobacco use: Secondary | ICD-10-CM

## 2021-08-31 DIAGNOSIS — J449 Chronic obstructive pulmonary disease, unspecified: Secondary | ICD-10-CM

## 2021-08-31 DIAGNOSIS — R32 Unspecified urinary incontinence: Secondary | ICD-10-CM

## 2021-08-31 LAB — COMPREHENSIVE METABOLIC PANEL
ALT: 9 U/L (ref 0–53)
AST: 11 U/L (ref 0–37)
Albumin: 4.3 g/dL (ref 3.5–5.2)
Alkaline Phosphatase: 59 U/L (ref 39–117)
BUN: 13 mg/dL (ref 6–23)
CO2: 29 mEq/L (ref 19–32)
Calcium: 9.4 mg/dL (ref 8.4–10.5)
Chloride: 105 mEq/L (ref 96–112)
Creatinine, Ser: 0.89 mg/dL (ref 0.40–1.50)
GFR: 82.56 mL/min (ref >60.00)
Glucose, Bld: 83 mg/dL (ref 70–99)
Potassium: 4.2 mEq/L (ref 3.5–5.1)
Sodium: 140 mEq/L (ref 135–145)
Total Bilirubin: 1 mg/dL (ref 0.2–1.2)
Total Protein: 6.7 g/dL (ref 6.0–8.3)

## 2021-08-31 LAB — CBC
HCT: 43.8 % (ref 39.0–52.0)
Hemoglobin: 14.7 g/dL (ref 13.0–17.0)
MCHC: 33.6 g/dL (ref 30.0–36.0)
MCV: 92.2 fl (ref 78.0–100.0)
Platelets: 240 10*3/uL (ref 150.0–400.0)
RBC: 4.75 Mil/uL (ref 4.22–5.81)
RDW: 15.3 % (ref 11.5–15.5)
WBC: 7.5 10*3/uL (ref 4.0–10.5)

## 2021-08-31 LAB — LIPID PANEL
Cholesterol: 120 mg/dL (ref 0–200)
HDL: 45.7 mg/dL (ref >39.00)
LDL Cholesterol: 59 mg/dL (ref 0–99)
NonHDL: 74.55
Total CHOL/HDL Ratio: 3
Triglycerides: 76 mg/dL (ref 0.0–149.0)
VLDL: 15.2 mg/dL (ref 0.0–40.0)

## 2021-08-31 LAB — PSA, MEDICARE: PSA: 0.06 ng/ml — ABNORMAL LOW (ref 0.10–4.00)

## 2021-08-31 MED ORDER — FLUTICASONE-SALMETEROL 250-50 MCG/ACT IN AEPB: 1.0000 | INHALATION_SPRAY | Freq: Two times a day (BID) | RESPIRATORY_TRACT | 1 refills | Status: DC

## 2021-08-31 MED ORDER — AMLODIPINE-ATORVASTATIN 10-10 MG PO TABS: 1.0000 | ORAL_TABLET | Freq: Every day | ORAL | 0 refills | Status: DC

## 2021-08-31 MED ORDER — TAMSULOSIN HCL 0.4 MG PO CAPS: 0.8000 mg | ORAL_CAPSULE | Freq: Every day | ORAL | 1 refills | Status: DC

## 2021-08-31 NOTE — Assessment & Plan Note (Signed)
Patient notes daily shortness of breath as well as cough.  Discussed increasing Wixela to 250-50 mcg/act.  Return in 4 weeks if not improved.

## 2021-08-31 NOTE — Patient Instructions (Addendum)
Increase your inhaler dose Use twice daily every day  Return in 4 weeks  You should get a phone call about the abdominal ultrasound

## 2021-08-31 NOTE — Assessment & Plan Note (Signed)
On tamsulosin, but having some incontinence.  Referral to urology.

## 2021-08-31 NOTE — Progress Notes (Signed)
Annual Exam   Chief Complaint:  Chief Complaint  Patient presents with   Medicare Wellness    Part 2   Referral    Urologist in [place]     History of Present Illness:  Christian Gordon is a 76 y.o. presents today for annual examination.    #Urianry frequency - taking 0.8 mg flomax - did get some improvement - waking up once per night - daytime - going frequently during the day, some incontinence   Nutrition/Lifestyle Diet: generally ok Exercise: PT for rotator cuff He is not sexually active.  Any issues with getting or keeping erection? Yes  Social History   Tobacco Use  Smoking Status Every Day   Packs/day: 0.50   Years: 60.00   Total pack years: 30.00   Types: Cigarettes   Start date: 2  Smokeless Tobacco Never  Tobacco Comments   Previously 1 ppd, quit and restarted at 0.5 ppd   Social History   Substance and Sexual Activity  Alcohol Use Yes   Alcohol/week: 1.0 standard drink of alcohol   Types: 1 Cans of beer per week   Comment: one beer per month    Social History   Substance and Sexual Activity  Drug Use Never     Safety The patient wears seatbelts: yes.     The patient feels safe at home and in their relationships: yes.  General Health Dentist in the last year: dentures Eye doctor: yes  Weight Wt Readings from Last 3 Encounters:  08/31/21 171 lb 4 oz (77.7 kg)  07/05/21 173 lb 7 oz (78.7 kg)  05/28/21 175 lb 6 oz (79.5 kg)   Patient has normal BMI  BMI Readings from Last 1 Encounters:  08/31/21 26.04 kg/m     Chronic disease screening Blood pressure monitoring:  BP Readings from Last 3 Encounters:  08/31/21 (!) 98/50  07/05/21 140/60  05/28/21 130/60    Lipid Monitoring: Indication for screening: age >35, obesity, diabetes, family hx, CV risk factors.  Lipid screening: Yes  Lab Results  Component Value Date   CHOL 118 06/08/2020   HDL 42.00 06/08/2020   LDLCALC 61 06/08/2020   TRIG 71.0 06/08/2020   CHOLHDL 3  06/08/2020     Diabetes Screening: age >40, overweight, family hx, PCOS, hx of gestational diabetes, at risk ethnicity, elevated blood pressure >135/80.  Diabetes Screening screening: Yes  No results found for: "HGBA1C"   Prostate Cancer Screening: Yes Age 36-69 yo Shared Decision Making Higher Risk: Older age, African American, Family Hx of Prostate Cancer - Yes Benefits: screening may prevent 1.3 deaths from prostate cancer over 13 years per 1000 men screened and prevent 3 metastatic cases per 1000 men screened. Not enough evidence to support more benefit for AA or [place] Harms: False Positive and psychological harms. 15% of me with false positive over a 2 to 4 year period > resulting in biopsy and complications such as pain, hematospermia, infections. Overdiagnosis - increases with age - found that 20-50% of prostate cancer through screening may have never caused any issues. Harms of treatment include - erectile dysfunction, urinary incontinence, and bothersome bowel symptoms.   After discussion he does want to get a PSA checked today.   Inadequate evidence for screening <55 No mortality benefit for screening >70   Lab Results  Component Value Date   PSA 0.08 (L) 06/08/2020   PSA 0.09 (L) 06/28/2019   PSA 0.1 08/25/2018       Colon Cancer Screening:  Age  6-75 yo - benefits outweigh the risk. Adults 89-85 yo who have never been screened benefit.  Benefits: 134000 people in 2016 will be diagnosed and 49,000 will die - early detection helps Harms: Complications 2/2 to colonoscopy High Risk (Colonoscopy): genetic disorder (Lynch syndrome or familial adenomatous polyposis), personal hx of IBD, previous adenomatous polyp, or previous colorectal cancer, FamHx start 10 years before the age at diagnosis, increased in males and black race  Options:  FIT - looks for hemoglobin (blood in the stool) - specific and fairly sensitive - must be done annually Cologuard - looks for DNA and blood  - more sensitive - therefore can have more false positives, every 3 years Colonoscopy - every 10 years if normal - sedation, bowl prep, must have someone drive you  Shared decision making and the patient had decided to do 6 years ago - colonoscopy.   Social History   Tobacco Use  Smoking Status Every Day   Packs/day: 0.50   Years: 60.00   Total pack years: 30.00   Types: Cigarettes   Start date: 22  Smokeless Tobacco Never  Tobacco Comments   Previously 1 ppd, quit and restarted at 0.5 ppd    Lung Cancer Screening (Ages 66-80): yes 20 year pack history? Yes Current Tobacco user? Yes Quit less than 15 years ago? No Interested in low dose CT for lung cancer screening? yes  Abdominal Aortic Aneurysm:  Age 107-75, 1 time screening, men who have ever smoked discussed    Past Medical History:  Diagnosis Date   Deaf, right    Dizzinesses    Hyperlipidemia    Hypertension    Prostate cancer ([place])    SOB (shortness of breath)     Past Surgical History:  Procedure Laterality Date   DENTAL SURGERY      Prior to Admission medications   Medication Sig Start Date End Date Taking? Authorizing Provider  amlodipine-atorvastatin (CADUET) 10-10 MG tablet TAKE 1 TABLET BY MOUTH EVERY DAY 09/18/20  Yes [name], MD  cetirizine (ZYRTEC) 10 MG tablet Take 10 mg by mouth daily as needed. 08/13/21  Yes [provider]  fluticasone-salmeterol (WIXELA INHUB) 100-50 MCG/ACT AEPB Inhale 1 puff into the lungs 2 (two) times daily. 11/21/20  Yes [name], MD  tamsulosin (FLOMAX) 0.4 MG CAPS capsule TAKE 2 CAPSULES BY MOUTH EVERY DAY 03/02/21  Yes [name], MD    No Known Allergies   Social History   Socioeconomic History   Marital status: Married    Spouse name: Gol[place]a   Number of children: 1   Years of education: some college   Highest education level: Not on file  Occupational History   Not on file  Tobacco Use   Smoking status: Every Day     Packs/day: 0.50    Years: 60.00    Total pack years: 30.00    Types: Cigarettes    Start date: 70   Smokeless tobacco: Never   Tobacco comments:    Previously 1 ppd, quit and restarted at 0.5 ppd  Vaping Use   Vaping Use: Never used  Substance and Sexual Activity   Alcohol use: Yes    Alcohol/week: 1.0 standard drink of alcohol    Types: 1 Cans of beer per week    Comment: one beer per month    Drug use: Never   Sexual activity: Not Currently    Partners: Female    Birth control/protection: None, Post-menopausal  Other  Topics Concern   Not on file  Social History Narrative   Left handed   Lives with wife, in a 17   From [place]      06/28/19   From: [place] moved to [facility] Feb 2021   Living: with partner [name]   Work: retired Medical illustrator      Family: Son - Wallace, step grandchildren      Enjoys: watch baseball, casinos       Exercise: not currently   Diet: [place] food, eating more fruit/veggies      Safety   Seat belts: Yes    Guns: Yes  and secure   Safe in relationships: Yes    Social Determinants of Health   Financial Resource Strain: Low Risk  (06/11/2021)   Overall Financial Resource Strain (CARDIA)    Difficulty of Paying Living Expenses: Not hard at all  Food Insecurity: No Food Insecurity (06/11/2021)   Hunger Vital Sign    Worried About Running Out of Food in the Last Year: Never true    [place] in the Last Year: Never true  Transportation Needs: No Transportation Needs (06/11/2021)   PRAPARE - Hydrologist (Medical): No    Lack of Transportation (Non-Medical): No  Physical Activity: Inactive (06/11/2021)   Exercise Vital Sign    Days of Exercise per Week: 0 days    Minutes of Exercise per Session: 0 min  Stress: No Stress Concern Present (06/11/2021)   [place]ar [place]el    Feeling of Stress : Not at all  Social  Connections: Socially Isolated (06/11/2021)   Social Connection and Isolation Panel [NHANES]    Frequency of Communication with Friends and Family: Once a week    Frequency of Social Gatherings with Friends and Family: Once a week    Attends Religious Services: Never    Marine scientist or Organizations: No    Attends Archivist Meetings: Never    Marital Status: Living with partner  Intimate Partner Violence: Not At Risk (06/11/2021)   Humiliation, Afraid, Rape, and Kick questionnaire    Fear of Current or Ex-Partner: No    Emotionally Abused: No    Physically Abused: No    Sexually Abused: No    Family History  Problem Relation Age of Onset   Heart disease Mother    Heart disease Father    Hypertension Father     Review of Systems  Constitutional:  Negative for chills and fever.  HENT:  Negative for congestion and sore throat.   Eyes:  Negative for blurred vision and double vision.  Respiratory:  Negative for shortness of breath.   Cardiovascular:  Negative for chest pain.  Gastrointestinal:  Negative for heartburn, nausea and vomiting.  Genitourinary: Negative.   Musculoskeletal: Negative.  Negative for myalgias.  Skin:  Negative for rash.  Neurological:  Negative for dizziness and headaches.  Endo/Heme/Allergies:  Does not bruise/bleed easily.  Psychiatric/Behavioral:  Negative for depression. The patient is not nervous/anxious.      Physical Exam BP (!) 98/50   Pulse 83   Temp 97.8 F (36.6 C) (Temporal)   Ht '5\' 8"'$  (1.727 m)   Wt 171 lb 4 oz (77.7 kg)   SpO2 95%   BMI 26.04 kg/m    BP Readings from Last 3 Encounters:  08/31/21 (!) 98/50  07/05/21 140/60  05/28/21 130/60  Physical Exam Constitutional:      General: He is not in acute distress.    Appearance: He is well-developed. He is not diaphoretic.  HENT:     Head: Normocephalic and atraumatic.     Right Ear: Tympanic membrane and ear canal normal.     Left Ear: Tympanic  membrane and ear canal normal.     Nose: Nose normal.     Mouth/Throat:     Pharynx: Uvula midline.  Eyes:     General: No scleral icterus.    Conjunctiva/sclera: Conjunctivae normal.     Pupils: Pupils are equal, round, and reactive to light.  Cardiovascular:     Rate and Rhythm: Normal rate and regular rhythm.     Heart sounds: Normal heart sounds. No murmur heard. Pulmonary:     Effort: Pulmonary effort is normal. No respiratory distress.     Breath sounds: Normal breath sounds. No wheezing.  Abdominal:     General: Bowel sounds are normal. There is no distension.     Palpations: Abdomen is soft. There is no mass.     Tenderness: There is no abdominal tenderness. There is no guarding.  Musculoskeletal:        General: Normal range of motion.     Cervical back: Normal range of motion and neck supple.  Lymphadenopathy:     Cervical: No cervical adenopathy.  Skin:    General: Skin is warm and dry.     Capillary Refill: Capillary refill takes less than 2 seconds.  Neurological:     Mental Status: He is alert and oriented to person, place, and time.        Results:  PHQ-9:  [place] Office Visit from 10/06/2019 in [place] at [place] [facility]  PHQ-9 Total Score 6         Assessment: 76 y.o. here for routine annual physical examination.  Plan: Problem List Items Addressed This Visit       Respiratory   COPD (chronic obstructive pulmonary disease) ([place])    Patient notes daily shortness of breath as well as cough.  Discussed increasing Wixela to 250-50 mcg/act.  Return in 4 weeks if not improved.      Relevant Medications   cetirizine (ZYRTEC) 10 MG tablet   fluticasone-salmeterol (WIXELA INHUB) 250-50 MCG/ACT AEPB     Other   History of prostate cancer    On tamsulosin, but having some incontinence.  Referral to urology.      Relevant Medications   tamsulosin (FLOMAX) 0.4 MG CAPS capsule   Other Relevant Orders   PSA, Medicare   Ambulatory  referral to Urology   Hyperlipidemia   Relevant Medications   amlodipine-atorvastatin (CADUET) 10-10 MG tablet   Other Relevant Orders   Lipid panel   Other Visit Diagnoses     Annual physical exam    -  Primary   Essential hypertension       Relevant Medications   amlodipine-atorvastatin (CADUET) 10-10 MG tablet   Other Relevant Orders   Comprehensive metabolic panel   CBC   Tobacco use       Relevant Orders   US [facility] MEDICARE SCREENING   Urinary incontinence, unspecified type       Relevant Medications   tamsulosin (FLOMAX) 0.4 MG CAPS capsule   Other Relevant Orders   Ambulatory referral to Urology       Screening: -- Blood pressure screen normal -- cholesterol screening: will obtain -- Weight screening: normal -- Diabetes Screening:  will obtain -- Nutrition: normal - Encouraged healthy diet  The ASCVD Risk score (Arnett DK, et al., 2019) failed to calculate for the following reasons:   The valid total cholesterol range is 130 to 320 mg/dL   Unable to determine if patient is Non-Hispanic African American  -- ASA 81 mg discussed if CVD risk >10% age 33-59 and willing to take for 10 years -- Statin therapy for Age 96-75 with CVD risk >7.5%  Psych -- Depression screening (PHQ-9): negative  Safety -- tobacco screening: not using -- alcohol screening:  low-risk usage. -- no evidence of domestic violence or intimate partner violence.  Cancer Screening -- Prostate (age 5-69) ordered -- Colon (age 71-75)  not indicated -- Lung  already ordered    Immunizations Immunization History  Administered Date(s) Administered   PFIZER(Purple Top)SARS-COV-2 Vaccination 03/22/2019, 04/18/2019, 07/19/2020   PNEUMOCOCCAL CONJUGATE-20 06/08/2020   Zoster Recombinat (Shingrix) 08/01/2020, 01/10/2021    -- flu vaccine seasonal -- TDAP q10 years not up to date - advised getting -- Shingles (age >63) up to date -- PPSV-23 (19-64 with chronic disease or smoking) not up to  date - encouraged getting -- PCV-13 (age >83) - one dose followed by PPSV-23 1 year later up to date -- Covid-19 Vaccine up to date  Encouraged regular vision and dental screening. Encouraged healthy exercise and diet.   [name]

## 2021-09-07 ENCOUNTER — Telehealth: Payer: Self-pay | Admitting: Family Medicine

## 2021-09-07 NOTE — Telephone Encounter (Signed)
Please make sure patient is aware that there may be a copay as it appears his insurance does not cover the screening

## 2021-09-07 NOTE — Telephone Encounter (Signed)
Alissa from [facility] called stating that patient is scheduled for free medicare screening on Monday 8/21,but he does not qualify for the screening. They would need a new order put in  for ultra sound [employer]. CPT [id number]

## 2021-09-07 NOTE — Telephone Encounter (Signed)
Pt made aware of possible copay. Will call his insurance company to find out more.

## 2021-09-10 ENCOUNTER — Ambulatory Visit
Admission: RE | Admit: 2021-09-10 | Discharge: 2021-09-10 | Disposition: A | Discharge status: Home / Self Care | Payer: Medicare HMO | Source: Ambulatory Visit | Attending: Family Medicine | Admitting: Family Medicine

## 2021-09-10 DIAGNOSIS — Z720 Tobacco use: Secondary | ICD-10-CM | POA: Diagnosis present

## 2021-09-13 ENCOUNTER — Telehealth: Payer: Self-pay

## 2021-09-13 NOTE — Telephone Encounter (Signed)
Gave patient message, had no questions.

## 2021-09-17 ENCOUNTER — Telehealth: Payer: Self-pay | Admitting: Family Medicine

## 2021-09-17 NOTE — Telephone Encounter (Signed)
Labs and [place] reports printed and placed up front for pt.

## 2021-09-17 NOTE — Telephone Encounter (Signed)
Patient came in requesting copies of his blood work and copies of his radiology test that was recently done. He said that he would come back this afternoon to pick it up. He said that you are aware of this request.

## 2021-09-19 ENCOUNTER — Encounter: Payer: Self-pay | Admitting: Urology

## 2021-09-19 ENCOUNTER — Ambulatory Visit (INDEPENDENT_AMBULATORY_CARE_PROVIDER_SITE_OTHER): Payer: Medicare HMO | Admitting: Urology

## 2021-09-19 VITALS — BP 173/77 | HR 87 | Ht 69.0 in | Wt 173.4 lb

## 2021-09-19 DIAGNOSIS — N5089 Other specified disorders of the male genital organs: Secondary | ICD-10-CM | POA: Diagnosis not present

## 2021-09-19 DIAGNOSIS — Z8546 Personal history of malignant neoplasm of prostate: Secondary | ICD-10-CM

## 2021-09-19 DIAGNOSIS — R32 Unspecified urinary incontinence: Secondary | ICD-10-CM | POA: Diagnosis not present

## 2021-09-19 LAB — BLADDER SCAN AMB NON-IMAGING

## 2021-09-19 NOTE — Progress Notes (Signed)
09/19/21 10:37 AM   Christian Gordon 1945-02-03 [id number]  CC: History of prostate cancer, scrotal itching, urinary urgency/frequency, ejaculation concerns  HPI: I saw Mr Christian Gordon today for the above issues.  He reportedly has a history of prostate cancer treated in [place] about 10 to 12 years ago with radiation.  Those notes are not available to me and unclear Gleason score or staging.  PSA has been very low since that time, 0.1 in 2020, 0.09 in 2021, 0.08 in 2022, and most recently 0.06.  He has been on 0.8 mg Flomax long-term.  He also reports long-term at least 20 years of scrotal itching, and he manages this by alternating steroid creams.  He reportedly has seen dermatology and tried numerous over-the-counter treatments with no improvement.  He has some concerns about decreased ejaculation fluid which has been present for at least 20 to 30 years, but seems to be worse.  Finally, he has some urinary urgency during the day, with occasional urge incontinence maybe 1 time per week.  He denies any gross hematuria or dysuria.  He drinks primarily orange soda during the day.  He was unable to void for a urinalysis today, and bladder scan was normal at 20 mL.  PMH: Past Medical History:  Diagnosis Date   Deaf, right    Dizzinesses    Hyperlipidemia    Hypertension    Prostate cancer ([place])    SOB (shortness of breath)     Surgical History: Past Surgical History:  Procedure Laterality Date   DENTAL SURGERY       Family History: Family History  Problem Relation Age of Onset   Heart disease Mother    Heart disease Father    Hypertension Father     Social History:  reports that he has been smoking cigarettes. He started smoking about 63 years ago. He has a 30.00 pack-year smoking history. He has never used smokeless tobacco. He reports current alcohol use of about 1.0 standard drink of alcohol per week. He reports that he does not use drugs.  Physical Exam: BP (!) 173/77 (BP  Location: Left Arm, Patient Position: Sitting, Cuff Size: Normal)   Pulse 87   Ht '5\' 9"'$  (1.753 m)   Wt 173 lb 6.4 oz (78.7 kg)   BMI 25.61 kg/m    Constitutional:  Alert and oriented, No acute distress. Cardiovascular: No clubbing, cyanosis, or edema. Respiratory: Normal respiratory effort, no increased work of breathing. GI: Abdomen is soft, nontender, nondistended, no abdominal masses GU: Circumcised phallus with patent meatus, no scrotal lesions, testicles 10 cc and descended bilaterally, 1 cm sebaceous cyst in the midline scrotum  Laboratory Data: PSA history reviewed, see HPI  Assessment & Plan:   76 year old male with history of prostate cancer treated with radiation over 10 years ago with very low PSA, mild urinary urgency and rare urge incontinence, chronic scrotal itching, chronic low-volume ejaculation.  Reassurance provided regarding his very low PSA trend, and we reviewed he could discontinue PSA monitoring at this point per the guideline recommendations, but he would like to continue yearly PSA with PCP.  Regarding his urinary symptoms I recommended starting with behavioral strategies and cutting back on sodas.  It also would be reasonable to stop the Flomax as this may improve his low-volume ejaculation concerns, prior to adding an OAB medication.  Regarding his scrotal itching, I am not sure I have a good solution for this, it sounds like has been chronic over 20 years and has  seen multiple dermatology and other providers without any improvement.  Stop Flomax, okay to resume if urinary symptoms worsen Behavioral strategies discussed regarding overactive bladder symptoms, consider OAB medication in the future Recommended 16-monthfollow-up for symptom check, but he prefers follow-up as needed   BNickolas Madrid MD 09/19/2021  [id number] [place] [phone]

## 2021-09-19 NOTE — Patient Instructions (Signed)
SARNA LOTION  Pruritus Pruritus is an itchy feeling on the skin. One of the most common causes is dry skin, but many different things can cause itching. Most cases of itching do not require medical attention. Sometimes itchy skin can turn into a rash or a secondary infection. Follow these instructions at home: Skin care  Do not use scented soaps, detergents, perfumes, and cosmetic products. Instead, use gentle, unscented versions of these items. Apply moisturizing creams to your skin frequently, at least twice daily. Apply immediately after bathing while skin is still wet. Take medicines or apply medicated creams only as told by your health care provider. This may include: Corticosteroid cream or topical calcineurin inhibitor. Anti-itch lotions containing urea, camphor, or menthol. Oral antihistamines. Do not take hot showers or baths, which can make itching worse. A short, cool shower may help with itching as long as you apply moisturizing lotion after the shower. Apply a cool, wet cloth (cool compress) to the affected areas. You may take lukewarm baths with one of the following: Epsom salts. You can get these at your local pharmacy or grocery store. Follow the instructions on the packaging. Baking soda. Pour a small amount into the bath as told by your health care provider. Colloidal oatmeal. You can get this at your local pharmacy or grocery store. Follow the instructions on the packaging. Do not scratch your skin. General instructions Avoid wearing tight clothes. Keep a journal to help find out what is causing your itching. Write down: What you eat and drink. What cosmetic products you use. What soaps or detergents you use. What you wear, including jewelry. Use a humidifier. This keeps the air moist, which helps to prevent dry skin. Be aware of any changes in your itchiness. Tell your health care provider about any changes. Contact a health care provider if: The itching does not go  away after several days. You notice redness, warmth, or drainage on the skin where you have scratched. You are unusually thirsty or urinating more than normal. Your skin tingles or feels numb. Your skin or the white parts of your eyes turn yellow (jaundice). You feel weak. You have any of the following: Night sweats. Tiredness (fatigue). Weight loss. Abdominal pain. Summary Pruritus is an itchy feeling on the skin. One of the most common causes is dry skin, but many different conditions and factors can cause itching. Apply moisturizing creams to your skin frequently, at least twice daily. Apply immediately after bathing while skin is still wet. Take medicines or apply medicated creams only as told by your health care provider. Do not take hot showers or baths. Do not use scented soaps, detergents, perfumes, or cosmetic products. Keep a journal to help find out what is causing your itching. This information is not intended to replace advice given to you by your health care provider. Make sure you discuss any questions you have with your health care provider. Document Revised: 02/14/2021 Document Reviewed: 02/14/2021 Elsevier Patient Education  2023 [employer].  

## 2021-10-18 ENCOUNTER — Ambulatory Visit (INDEPENDENT_AMBULATORY_CARE_PROVIDER_SITE_OTHER): Payer: Medicare HMO | Admitting: Podiatry

## 2021-10-18 DIAGNOSIS — Q828 Other specified congenital malformations of skin: Secondary | ICD-10-CM | POA: Diagnosis not present

## 2021-10-18 NOTE — Progress Notes (Signed)
  Subjective:  Patient ID: Christian Gordon, male    DOB: 18-Aug-1945,  MRN: [id number]  Chief Complaint  Patient presents with   Callouses    76 y.o. male presents with the above complaint.  Patient presents with right fifth digit porokeratotic lesion painful to touch is progressive gotten worse hurts with ambulation hurts with pressure.  He would like to get evaluated he has not seen in 1 is prior to seeing me.  Denies any other acute complaints.   Review of Systems: Negative except as noted in the HPI. Denies N/V/F/Ch.  Past Medical History:  Diagnosis Date   Deaf, right    Dizzinesses    Hyperlipidemia    Hypertension    Prostate cancer ([place])    SOB (shortness of breath)     Current Outpatient Medications:    amlodipine-atorvastatin (CADUET) 10-10 MG tablet, Take 1 tablet by mouth daily., Disp: 90 tablet, Rfl: 0   cetirizine (ZYRTEC) 10 MG tablet, Take 10 mg by mouth daily as needed., Disp: , Rfl:    fluticasone-salmeterol (WIXELA INHUB) 250-50 MCG/ACT AEPB, Inhale 1 puff into the lungs in the morning and at bedtime., Disp: 60 each, Rfl: 1   triamcinolone cream (KENALOG) 0.1 %, Apply 1 Application topically 2 (two) times daily., Disp: , Rfl:   Social History   Tobacco Use  Smoking Status Every Day   Packs/day: 0.50   Years: 60.00   Total pack years: 30.00   Types: Cigarettes   Start date: 37   Passive exposure: Current  Smokeless Tobacco Never  Tobacco Comments   Previously 1 ppd, quit and restarted at 0.5 ppd    No Known Allergies Objective:  There were no vitals filed for this visit. There is no height or weight on file to calculate BMI. Constitutional Well developed. Well nourished.  Vascular Dorsalis pedis pulses palpable bilaterally. Posterior tibial pulses palpable bilaterally. Capillary refill normal to all digits.  No cyanosis or clubbing noted. Pedal hair growth normal.  Neurologic Normal speech. Oriented to person, place, and time. Epicritic  sensation to light touch grossly present bilaterally.  Dermatologic Hyperkeratotic lesion with central nucleated core noted right fifth digit mild pain on palpation.  Hurts with ambulation hurts with pressure.  Orthopedic: Normal joint ROM without pain or crepitus bilaterally. No visible deformities. No bony tenderness.   Radiographs: None Assessment:   1. Porokeratosis    Plan:  Patient was evaluated and treated and all questions answered.  Right fifth digit porokeratosis -All questions and concerns were discussed with the patient in extensive detail.  Given the amount of pain that he is having he will benefit from aggressive debridement of the lesion.  Using chisel blade to handle the lesion was debrided down to healthy dry tissue.  No complication noted no pinpoint bleeding noted.   No follow-ups on file.

## 2021-11-15 ENCOUNTER — Ambulatory Visit (INDEPENDENT_AMBULATORY_CARE_PROVIDER_SITE_OTHER): Payer: Medicare HMO | Admitting: Podiatry

## 2021-11-15 DIAGNOSIS — M2011 Hallux valgus (acquired), right foot: Secondary | ICD-10-CM

## 2021-11-15 DIAGNOSIS — M79674 Pain in right toe(s): Secondary | ICD-10-CM

## 2021-11-15 DIAGNOSIS — B351 Tinea unguium: Secondary | ICD-10-CM

## 2021-11-15 DIAGNOSIS — M79675 Pain in left toe(s): Secondary | ICD-10-CM | POA: Diagnosis not present

## 2021-11-15 DIAGNOSIS — L84 Corns and callosities: Secondary | ICD-10-CM | POA: Insufficient documentation

## 2021-11-15 NOTE — Progress Notes (Signed)
This patient returns to the office for evaluation and treatment of long thick painful nails .  This patient is unable to trim his own nails since the patient cannot reach his feet.  Patient says the nails are painful walking and wearing his shoes.  He returns for preventive foot care services.He was treated for painful corn by Dr.  Posey Pronto and told me to leave his padding on fifth toe right foot alone.  General Appearance  Alert, conversant and in no acute stress.  Vascular  Dorsalis pedis and posterior tibial  pulses are palpable  bilaterally.  Capillary return is within normal limits  bilaterally. Temperature is within normal limits  bilaterally.  Neurologic  Senn-Weinstein monofilament wire test within normal limits  bilaterally. Muscle power within normal limits bilaterally.  Nails Thick disfigured discolored nails with subungual debris  from second to fifth toes bilaterally. No evidence of bacterial infection or drainage bilaterally.  Orthopedic  No limitations of motion  feet .  No crepitus or effusions noted.  No bony pathology or digital deformities noted.  HAV right foot.  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  Skin  normotropic skin with no porokeratosis noted bilaterally.  No signs of infections or ulcers noted.     Onychomycosis  Pain in toes right foot  Pain in toes left foot  Debridement  of nails  2-5  B/L with a nail nipper.  Nails were then filed using a dremel tool with no incidents.    RTC  3 months   Gardiner Barefoot DPM

## 2021-11-21 ENCOUNTER — Other Ambulatory Visit: Payer: Self-pay

## 2021-11-21 DIAGNOSIS — J449 Chronic obstructive pulmonary disease, unspecified: Secondary | ICD-10-CM

## 2021-11-21 NOTE — Telephone Encounter (Signed)
Refill request received no TOC appointment made. Sending message to support pool to call for TOC. Ok to refill?

## 2021-11-21 NOTE — Telephone Encounter (Signed)
Patient TOC appointment has been scheduled.

## 2021-11-22 ENCOUNTER — Other Ambulatory Visit: Payer: Self-pay

## 2021-11-22 MED ORDER — FLUTICASONE-SALMETEROL 250-50 MCG/ACT IN AEPB: 1.0000 | INHALATION_SPRAY | Freq: Two times a day (BID) | RESPIRATORY_TRACT | 1 refills | Status: DC

## 2021-11-22 NOTE — Progress Notes (Signed)
error 

## 2021-12-17 ENCOUNTER — Telehealth: Payer: Self-pay | Admitting: Family Medicine

## 2021-12-17 DIAGNOSIS — E782 Mixed hyperlipidemia: Secondary | ICD-10-CM

## 2021-12-17 DIAGNOSIS — I10 Essential (primary) hypertension: Secondary | ICD-10-CM

## 2021-12-17 NOTE — Telephone Encounter (Signed)
  Encourage patient to contact the pharmacy for refills or they can request refills through [facility]  Did the patient contact the pharmacy: yes    LAST APPOINTMENT DATE:   NEXT APPOINTMENT DATE:01/18/22 TOC  MEDICATION:amlodipine-atorvastatin (CADUET) 10-10 MG tablet   Is the patient out of medication? no  If not, how much is left? 1 week left  Is this a 90 day supply: yes  [id number]: [phone] Fax: [phone]     Let patient know to contact pharmacy at the end of the day to make sure medication is ready.  Please notify patient to allow 48-72 hours to process

## 2021-12-18 ENCOUNTER — Telehealth: Payer: Self-pay | Admitting: Family Medicine

## 2021-12-18 NOTE — Telephone Encounter (Signed)
Updated medications as requested. Sending for review to [facility] on 01/18/22

## 2021-12-18 NOTE — Telephone Encounter (Signed)
Pt called stating "tamsulosin, 0.'4mg'$ " needs to be refilled also. Pt mentioned this medication was removed off med list for some reason & he isn't sure why. Pt stated the "kenalog" should've been the meds removed off list. [facility] [address]

## 2021-12-19 ENCOUNTER — Other Ambulatory Visit: Payer: Self-pay

## 2021-12-19 DIAGNOSIS — E782 Mixed hyperlipidemia: Secondary | ICD-10-CM

## 2021-12-19 DIAGNOSIS — I10 Essential (primary) hypertension: Secondary | ICD-10-CM

## 2021-12-19 MED ORDER — AMLODIPINE-ATORVASTATIN 10-10 MG PO TABS: 1.0000 | ORAL_TABLET | Freq: Every day | ORAL | 0 refills | Status: DC

## 2021-12-19 NOTE — Telephone Encounter (Signed)
Pt called to know status of refill

## 2021-12-20 ENCOUNTER — Ambulatory Visit
Admission: RE | Admit: 2021-12-20 | Discharge: 2021-12-20 | Disposition: A | Discharge status: Home / Self Care | Payer: Medicare HMO | Source: Ambulatory Visit | Attending: Family Medicine | Admitting: Family Medicine

## 2021-12-20 DIAGNOSIS — Z87891 Personal history of nicotine dependence: Secondary | ICD-10-CM | POA: Insufficient documentation

## 2021-12-20 DIAGNOSIS — F17210 Nicotine dependence, cigarettes, uncomplicated: Secondary | ICD-10-CM | POA: Diagnosis present

## 2021-12-20 DIAGNOSIS — Z122 Encounter for screening for malignant neoplasm of respiratory organs: Secondary | ICD-10-CM | POA: Insufficient documentation

## 2021-12-20 MED ORDER — TAMSULOSIN HCL 0.4 MG PO CAPS: ORAL_CAPSULE | ORAL | 1 refills | Status: DC

## 2021-12-21 ENCOUNTER — Other Ambulatory Visit: Payer: Self-pay | Admitting: Acute Care

## 2021-12-21 DIAGNOSIS — Z87891 Personal history of nicotine dependence: Secondary | ICD-10-CM

## 2021-12-21 DIAGNOSIS — Z122 Encounter for screening for malignant neoplasm of respiratory organs: Secondary | ICD-10-CM

## 2021-12-21 DIAGNOSIS — F17210 Nicotine dependence, cigarettes, uncomplicated: Secondary | ICD-10-CM

## 2021-12-21 NOTE — Telephone Encounter (Signed)
Pt called stated he still has not got his medication

## 2021-12-21 NOTE — Telephone Encounter (Signed)
[facility] all the [facility]s that were refilled, Tamsulosin was a little too early but can go ahead and fill it. Patient advised, nothing further is needed

## 2022-01-16 ENCOUNTER — Other Ambulatory Visit: Payer: Self-pay | Admitting: Nurse Practitioner

## 2022-01-18 ENCOUNTER — Encounter: Payer: Self-pay | Admitting: Nurse Practitioner

## 2022-01-18 ENCOUNTER — Ambulatory Visit (INDEPENDENT_AMBULATORY_CARE_PROVIDER_SITE_OTHER): Payer: Medicare HMO | Admitting: Nurse Practitioner

## 2022-01-18 VITALS — BP 154/70 | HR 87 | Temp 97.8°F | Resp 16 | Ht 69.0 in | Wt 171.0 lb

## 2022-01-18 DIAGNOSIS — J432 Centrilobular emphysema: Secondary | ICD-10-CM

## 2022-01-18 DIAGNOSIS — I10 Essential (primary) hypertension: Secondary | ICD-10-CM

## 2022-01-18 DIAGNOSIS — I7090 Unspecified atherosclerosis: Secondary | ICD-10-CM

## 2022-01-18 DIAGNOSIS — Z8546 Personal history of malignant neoplasm of prostate: Secondary | ICD-10-CM

## 2022-01-18 DIAGNOSIS — K769 Liver disease, unspecified: Secondary | ICD-10-CM

## 2022-01-18 DIAGNOSIS — R3915 Urgency of urination: Secondary | ICD-10-CM

## 2022-01-18 DIAGNOSIS — E782 Mixed hyperlipidemia: Secondary | ICD-10-CM

## 2022-01-18 DIAGNOSIS — H9191 Unspecified hearing loss, right ear: Secondary | ICD-10-CM

## 2022-01-18 MED ORDER — TAMSULOSIN HCL 0.4 MG PO CAPS: ORAL_CAPSULE | ORAL | 1 refills | Status: DC

## 2022-01-18 MED ORDER — AMLODIPINE-ATORVASTATIN 10-10 MG PO TABS: 1.0000 | ORAL_TABLET | Freq: Every day | ORAL | 1 refills | Status: DC

## 2022-01-18 MED ORDER — ALBUTEROL SULFATE HFA 108 (90 BASE) MCG/ACT IN AERS
2.0000 | INHALATION_SPRAY | Freq: Four times a day (QID) | RESPIRATORY_TRACT | 0 refills | Status: DC | PRN
Start: 2022-01-18 — End: 2022-02-14

## 2022-01-18 NOTE — Assessment & Plan Note (Signed)
Patient is on Wixela inhaler.  States he will use it as needed.  Encourage patient to use it as prescribed twice daily and rinse mouth after use.  Will also send in albuterol inhaler to use as needed

## 2022-01-18 NOTE — Assessment & Plan Note (Signed)
Has been seen by urology Dr. Diamantina Providence,

## 2022-01-18 NOTE — Assessment & Plan Note (Signed)
Patient currently maintained on 0.8 mg tamsulosin.  Tolerates medication well.  Does help his urinary urgency per patient report.  Does not experience nocturia about once a night maybe 3 times a week.

## 2022-01-18 NOTE — Assessment & Plan Note (Signed)
Patient already on a statin medication.

## 2022-01-18 NOTE — Assessment & Plan Note (Signed)
Right-sided per patient report

## 2022-01-18 NOTE — Progress Notes (Signed)
Established Patient Office Visit  Subjective   Patient ID: Christian Gordon, male    DOB: 05/22/1945  Age: 76 y.o. MRN: [id number]  Chief Complaint  Patient presents with   Establish Care    Transfer from Dr. Einar Pheasant    HPI  Transfer of Care: 08/31/2021 by Waunita Schooner, MD Last CPE 08/31/2021  HTN: Caduet. Does not check BP at home.  COPD/Emphysema: States that he is using it when he needs. States that he has SHOB at baseline. With exertion   HLD: States that he tolerates Caduet medication 2 meals a day sometimes one a day and sometimes he will have a snack. Coffee, water, and soda. Mostly soda  BPH: states that he will get up once a night 3 times a week. Sttes that I helps with frequency   LDCT: 12/20/2021, repeat in 12 months.  Did show aortic atherosclerosis along with emphysema.  Does have a 2 mm solid nodule on the right middle lung TDAP: States 8 years ago.  Told patient if he needs updated to do at local pharmacy. Covid: Patient has had Pfizer x 2 and at least 1 booster. Flu: 11/12/2021  Colonoscopy: 7-8 years.  We do have joint discussion about whether to continue or not to continue screenings.  Patient will think about it.   Review of Systems  Constitutional:  Negative for chills and fever.  Respiratory:  Positive for shortness of breath.   Cardiovascular:  Positive for leg swelling. Negative for chest pain.  Gastrointestinal:  Negative for abdominal pain, constipation, diarrhea, nausea and vomiting.       Bm every other day   Genitourinary:  Positive for urgency. Negative for dysuria and frequency.  Neurological:  Negative for headaches.  Psychiatric/Behavioral:  Negative for hallucinations and suicidal ideas.       Objective:     BP (!) 154/70   Pulse 87   Temp 97.8 F (36.6 C)   Resp 16   Ht '5\' 9"'$  (1.753 m)   Wt 171 lb (77.6 kg)   SpO2 95%   BMI 25.25 kg/m  BP Readings from Last 3 Encounters:  01/18/22 (!) 154/70  09/19/21 (!) 173/77  08/31/21  (!) 98/50   Wt Readings from Last 3 Encounters:  01/18/22 171 lb (77.6 kg)  09/19/21 173 lb 6.4 oz (78.7 kg)  08/31/21 171 lb 4 oz (77.7 kg)      Physical Exam Vitals and nursing note reviewed.  Constitutional:      Appearance: Normal appearance.  HENT:     Right Ear: Tympanic membrane, ear canal and external ear normal.     Left Ear: Tympanic membrane, ear canal and external ear normal.     Mouth/Throat:     Mouth: Mucous membranes are moist.     Pharynx: Oropharynx is clear.  Eyes:     Extraocular Movements: Extraocular movements intact.     Pupils: Pupils are equal, round, and reactive to light.     Comments: glasses  Cardiovascular:     Rate and Rhythm: Normal rate and regular rhythm.     Heart sounds: Normal heart sounds.  Pulmonary:     Effort: Pulmonary effort is normal.     Breath sounds: Normal breath sounds.  Musculoskeletal:     Right lower leg: No edema.     Left lower leg: No edema.  Lymphadenopathy:     Cervical: No cervical adenopathy.  Skin:    General: Skin is warm.  Neurological:  Mental Status: He is alert.      No results found for any visits on 01/18/22.    The ASCVD Risk score (Arnett DK, et al., 2019) failed to calculate for the following reasons:   The valid total cholesterol range is 130 to 320 mg/dL   Unable to determine if patient is Non-Hispanic African American    Assessment & Plan:   Problem List Items Addressed This Visit       Cardiovascular and Mediastinum   Essential hypertension - Primary    Patient currently mains on amlodipine 10 mg.  Tolerates medication well.  Blood pressure slightly above goal.  Continue medication as prescribed      Relevant Medications   amlodipine-atorvastatin (CADUET) 10-10 MG tablet   Atherosclerosis    Patient already on a statin medication.      Relevant Medications   amlodipine-atorvastatin (CADUET) 10-10 MG tablet     Respiratory   COPD (chronic obstructive pulmonary disease)  ([place])    Patient is on Wixela inhaler.  States he will use it as needed.  Encourage patient to use it as prescribed twice daily and rinse mouth after use.  Will also send in albuterol inhaler to use as needed      Relevant Medications   albuterol (VENTOLIN HFA) 108 (90 Base) MCG/ACT inhaler     Digestive   Liver lesion    Noted on CT scan last LDCT was November 2023 showed stable lesion        Nervous and Auditory   Deafness    Right-sided per patient report        Other   History of prostate cancer    Has been seen by urology Dr. Diamantina Providence,      Hyperlipidemia    Patient currently on statin therapy.      Relevant Medications   amlodipine-atorvastatin (CADUET) 10-10 MG tablet   Urinary urgency    Patient currently maintained on 0.8 mg tamsulosin.  Tolerates medication well.  Does help his urinary urgency per patient report.  Does not experience nocturia about once a night maybe 3 times a week.      Relevant Medications   tamsulosin (FLOMAX) 0.4 MG CAPS capsule    Return in about 6 months (around 07/20/2022) for COPD/HTN recheck.    Romilda Garret, NP

## 2022-01-18 NOTE — Assessment & Plan Note (Signed)
Patient currently on statin therapy.

## 2022-01-18 NOTE — Patient Instructions (Addendum)
Nice to see you today I have refilled your medications I want to see you in 6 months, sooner if you need me  I want you to use the Wixela inhaler everyday and I have sent in the albuterol inhaler to use on top of that if you get short or breath or wheeze

## 2022-01-18 NOTE — Assessment & Plan Note (Signed)
Patient currently mains on amlodipine 10 mg.  Tolerates medication well.  Blood pressure slightly above goal.  Continue medication as prescribed

## 2022-01-18 NOTE — Assessment & Plan Note (Signed)
Noted on CT scan last LDCT was November 2023 showed stable lesion

## 2022-02-14 ENCOUNTER — Other Ambulatory Visit: Payer: Self-pay | Admitting: Nurse Practitioner

## 2022-02-14 DIAGNOSIS — J432 Centrilobular emphysema: Secondary | ICD-10-CM

## 2022-02-18 ENCOUNTER — Encounter: Payer: Self-pay | Admitting: *Deleted

## 2022-02-18 ENCOUNTER — Encounter: Payer: Self-pay | Admitting: Family Medicine

## 2022-02-18 ENCOUNTER — Other Ambulatory Visit: Payer: Self-pay | Admitting: Family

## 2022-02-18 ENCOUNTER — Ambulatory Visit: Payer: Medicare HMO | Admitting: Podiatry

## 2022-02-18 ENCOUNTER — Ambulatory Visit (INDEPENDENT_AMBULATORY_CARE_PROVIDER_SITE_OTHER): Payer: Medicare HMO | Admitting: Family Medicine

## 2022-02-18 ENCOUNTER — Ambulatory Visit (INDEPENDENT_AMBULATORY_CARE_PROVIDER_SITE_OTHER)
Admission: RE | Admit: 2022-02-18 | Discharge: 2022-02-18 | Disposition: A | Discharge status: Home / Self Care | Payer: Medicare HMO | Source: Ambulatory Visit | Attending: Family Medicine | Admitting: Family Medicine

## 2022-02-18 VITALS — BP 154/66 | HR 88 | Temp 98.1°F | Ht 69.0 in | Wt 172.1 lb

## 2022-02-18 DIAGNOSIS — J449 Chronic obstructive pulmonary disease, unspecified: Secondary | ICD-10-CM

## 2022-02-18 DIAGNOSIS — M5137 Other intervertebral disc degeneration, lumbosacral region: Secondary | ICD-10-CM

## 2022-02-18 DIAGNOSIS — M545 Low back pain, unspecified: Secondary | ICD-10-CM

## 2022-02-18 MED ORDER — CELECOXIB 200 MG PO CAPS: 200.0000 mg | ORAL_CAPSULE | Freq: Every day | ORAL | 1 refills | Status: DC

## 2022-02-18 MED ORDER — TRAMADOL HCL 50 MG PO TABS
50.0000 mg | ORAL_TABLET | Freq: Three times a day (TID) | ORAL | 0 refills | Status: DC | PRN
Start: 2022-02-18 — End: 2022-07-22

## 2022-02-18 NOTE — Progress Notes (Unsigned)
    Christian Gordon T. Danessa Mensch, MD, [place] at [facility] [place], [address]  Phone: [phone]  FAX: [phone]  Christian Gordon - 77 y.o. male  MRN [id number]  Date of Birth: 1945-09-29  Date: 02/18/2022  PCP: Michela Pitcher, NP  Referral: Michela Pitcher, NP  Chief Complaint  Patient presents with   Back Pain    Low Back   Hip Pain    Bilateral    Subjective:   Christian Gordon is a 77 y.o. very pleasant male patient with Body mass index is 25.42 kg/m. who presents with the following:  He presents with some ongoing issues of leg discomfort and issues.  Low back is hurting and hips are hurting.   2 years of back pain.  Unable to get any exercise - cannot even walk at all. Hips means upper buttocks.   Takes some aspirin for his back pain.     Review of Systems is noted in the HPI, as appropriate  Objective:   BP (!) 154/66   Pulse 88   Temp 98.1 F (36.7 C) (Oral)   Ht '5\' 9"'$  (1.753 m)   Wt 172 lb 2 oz (78.1 kg)   SpO2 96%   BMI 25.42 kg/m   GEN: No acute distress; alert,appropriate. PULM: Breathing comfortably in no respiratory distress PSYCH: Normally interactive.   Laboratory and Imaging Data:  Assessment and Plan:   ***

## 2022-02-18 NOTE — Telephone Encounter (Signed)
This encounter was created in error - please disregard.

## 2022-03-11 ENCOUNTER — Ambulatory Visit (INDEPENDENT_AMBULATORY_CARE_PROVIDER_SITE_OTHER): Payer: Medicare HMO | Admitting: Podiatry

## 2022-03-11 ENCOUNTER — Encounter: Payer: Self-pay | Admitting: Podiatry

## 2022-03-11 VITALS — BP 151/70 | HR 85

## 2022-03-11 DIAGNOSIS — M79674 Pain in right toe(s): Secondary | ICD-10-CM

## 2022-03-11 DIAGNOSIS — B351 Tinea unguium: Secondary | ICD-10-CM | POA: Diagnosis not present

## 2022-03-11 DIAGNOSIS — M79675 Pain in left toe(s): Secondary | ICD-10-CM

## 2022-03-12 NOTE — Progress Notes (Signed)
  Subjective:  Patient ID: Christian Gordon, male    DOB: [birth year],  MRN: [id number]  Chief Complaint  Patient presents with   Nail Problem    "I'm getting my toenails clipped."    77 y.o. male presents with the above complaint. History confirmed with patient.  His nails are thickened elongated and causing discomfort.  He usually sees Dr. Prudence Davidson for this.  Also has a painful corn over the right fifth toe and uses padding for this  Objective:  Physical Exam: warm, good capillary refill, no trophic changes or ulcerative lesions, normal DP and PT pulses, and normal sensory exam. Left Foot: dystrophic yellowed discolored nail plates with subungual debris Right Foot: dystrophic yellowed discolored nail plates with subungual debris and painful corn fifth toe and distal tip of third  Assessment:   1. Pain due to onychomycosis of toenails of both feet      Plan:  Patient was evaluated and treated and all questions answered.  Discussed the etiology and treatment options for the condition in detail with the patient. Educated patient on the topical and oral treatment options for mycotic nails. Recommended debridement of the nails today. Sharp and mechanical debridement performed of all painful and mycotic nails today. Nails debrided in length and thickness using a nail nipper to level of comfort. Discussed treatment options including appropriate shoe gear. Follow up as needed for painful nails.  Regarding the distal tip third toe lesion I debrided as a courtesy today.  I helped him a place offloading pads on the right fifth toe and dispensed some additional pads that he may try.  Return sooner if this worsens.  Return in about 3 months (around 06/09/2022) for painful thick fungal nails.

## 2022-04-11 ENCOUNTER — Telehealth: Payer: Self-pay | Admitting: Nurse Practitioner

## 2022-04-11 ENCOUNTER — Other Ambulatory Visit: Payer: Self-pay

## 2022-04-11 DIAGNOSIS — J449 Chronic obstructive pulmonary disease, unspecified: Secondary | ICD-10-CM

## 2022-04-11 MED ORDER — FLUTICASONE-SALMETEROL 250-50 MCG/ACT IN AEPB: 1.0000 | INHALATION_SPRAY | Freq: Two times a day (BID) | RESPIRATORY_TRACT | 1 refills | Status: DC

## 2022-04-11 NOTE — Telephone Encounter (Signed)
Rx request sent to provider

## 2022-04-11 NOTE — Telephone Encounter (Signed)
fluticasone-salmeterol [facility] INHUB) 250-50 MCG/ACT AEPB  Last visit 01/18/2022 Next Visit 07/22/2022

## 2022-04-11 NOTE — Telephone Encounter (Signed)
Prescription Request  04/11/2022  LOV: 01/18/2022  What is the name of the medication or equipment? fluticasone-salmeterol (WIXELA INHUB) 250-50 MCG/ACT AEPB   Have you contacted your pharmacy to request a refill? No   Which pharmacy would you like this sent to?  [id number] [place] [address] [place] [address] Phone: [phone] Fax: [phone]    Patient notified that their request is being sent to the clinical staff for review and that they should receive a response within 2 business days.   Please advise at [facility] [phone]

## 2022-06-12 ENCOUNTER — Ambulatory Visit (INDEPENDENT_AMBULATORY_CARE_PROVIDER_SITE_OTHER): Payer: Medicare HMO | Admitting: Podiatry

## 2022-06-12 ENCOUNTER — Other Ambulatory Visit: Payer: Self-pay | Admitting: Nurse Practitioner

## 2022-06-12 DIAGNOSIS — M79675 Pain in left toe(s): Secondary | ICD-10-CM | POA: Diagnosis not present

## 2022-06-12 DIAGNOSIS — M79674 Pain in right toe(s): Secondary | ICD-10-CM

## 2022-06-12 DIAGNOSIS — B351 Tinea unguium: Secondary | ICD-10-CM | POA: Diagnosis not present

## 2022-06-12 DIAGNOSIS — J449 Chronic obstructive pulmonary disease, unspecified: Secondary | ICD-10-CM

## 2022-06-12 NOTE — Progress Notes (Signed)
  Subjective:  Patient ID: Christian Gordon, male    DOB: 1945/07/05,  MRN: [id number]  Chief Complaint  Patient presents with   Nail Problem    Thick painful toenails, 3 month follow up    77 y.o. male presents with the above complaint. History confirmed with patient.  His nails are thickened elongated and causing discomfort.    Objective:  Physical Exam: warm, good capillary refill, no trophic changes or ulcerative lesions, normal DP and PT pulses, and normal sensory exam. Left Foot: dystrophic yellowed discolored nail plates with subungual debris Right Foot: dystrophic yellowed discolored nail plates with subungual debris  Assessment:   1. Pain due to onychomycosis of toenails of both feet      Plan:  Patient was evaluated and treated and all questions answered.  Discussed the etiology and treatment options for the condition in detail with the patient. Educated patient on the topical and oral treatment options for mycotic nails. Recommended debridement of the nails today. Sharp and mechanical debridement performed of all painful and mycotic nails today. Nails debrided in length and thickness using a nail nipper to level of comfort. Discussed treatment options including appropriate shoe gear. Follow up as needed for painful nails.   Return in about 3 months (around 09/12/2022) for painful thick fungal nails.

## 2022-06-20 NOTE — Telephone Encounter (Signed)
Error

## 2022-07-22 ENCOUNTER — Ambulatory Visit (INDEPENDENT_AMBULATORY_CARE_PROVIDER_SITE_OTHER): Payer: Medicare HMO | Admitting: Nurse Practitioner

## 2022-07-22 ENCOUNTER — Encounter: Payer: Self-pay | Admitting: Nurse Practitioner

## 2022-07-22 VITALS — BP 130/82 | HR 84 | Temp 98.6°F | Ht 69.0 in | Wt 169.0 lb

## 2022-07-22 DIAGNOSIS — J449 Chronic obstructive pulmonary disease, unspecified: Secondary | ICD-10-CM

## 2022-07-22 DIAGNOSIS — Z8546 Personal history of malignant neoplasm of prostate: Secondary | ICD-10-CM | POA: Diagnosis not present

## 2022-07-22 DIAGNOSIS — I7090 Unspecified atherosclerosis: Secondary | ICD-10-CM

## 2022-07-22 DIAGNOSIS — I10 Essential (primary) hypertension: Secondary | ICD-10-CM | POA: Diagnosis not present

## 2022-07-22 DIAGNOSIS — Z720 Tobacco use: Secondary | ICD-10-CM | POA: Diagnosis not present

## 2022-07-22 DIAGNOSIS — Z5320 Procedure and treatment not carried out because of patient's decision for unspecified reasons: Secondary | ICD-10-CM

## 2022-07-22 DIAGNOSIS — Z125 Encounter for screening for malignant neoplasm of prostate: Secondary | ICD-10-CM

## 2022-07-22 MED ORDER — TRIAMCINOLONE ACETONIDE 0.1 % EX CREA: 1.0000 | TOPICAL_CREAM | Freq: Two times a day (BID) | CUTANEOUS | 0 refills | Status: DC

## 2022-07-22 NOTE — Assessment & Plan Note (Signed)
Patient currently on amlodipine 10 mg daily.  Blood pressure within normal limits.  Patient tolerates medication well.  Continue

## 2022-07-22 NOTE — Assessment & Plan Note (Signed)
History of the same.  Has been cleared from urology pending PSA today

## 2022-07-22 NOTE — Assessment & Plan Note (Signed)
Patient currently maintained on atorvastatin 10 mg.  Tolerates medication well.  Pending lipid panel today.  Patient is also still smoking.

## 2022-07-22 NOTE — Assessment & Plan Note (Signed)
Will check urine microscopy for microscopic hematuria in setting of tobacco abuse

## 2022-07-22 NOTE — Progress Notes (Signed)
Established Patient Office Visit  Subjective   Patient ID: Christian Gordon, male    DOB: 18-May-1945  Age: 77 y.o. MRN: [id number]  Chief Complaint  Patient presents with   Hypertension   COPD      HTN: patient is currently maintained on amlodipine 10mg  daily. Does not check his blood pressure at home  COPD: patient is on wixela. Was using it as needed. Also has albuterol inhaler to use. States that he is wixela twice daily and has used his albuterol 3 times in the past 6 months    Prostate cancer:  History of the same. Was followed by urology and cleared. Last PSA below normal limits  Tdap: within 10 years Shingrix: UTD PNA: prevnar 20 06/08/2020 Flu: completed this season   Colonoscopy: patient refused. Aged out        Review of Systems  Constitutional:  Negative for chills and fever.  Respiratory:  Positive for cough and shortness of breath.   Cardiovascular:  Negative for chest pain and leg swelling.  Gastrointestinal:  Negative for abdominal pain, blood in stool, constipation, diarrhea, nausea and vomiting.       BM daily   Genitourinary:  Negative for dysuria and hematuria.  Neurological:  Negative for tingling and headaches.  Psychiatric/Behavioral:  Negative for hallucinations and suicidal ideas.       Objective:     BP 130/82   Pulse 84   Temp 98.6 F (37 C) (Temporal)   Ht 5\' 9"  (1.753 m)   Wt 169 lb (76.7 kg)   SpO2 93%   BMI 24.96 kg/m  BP Readings from Last 3 Encounters:  07/22/22 130/82  03/11/22 (!) 151/70  02/18/22 (!) 154/66   Wt Readings from Last 3 Encounters:  07/22/22 169 lb (76.7 kg)  02/18/22 172 lb 2 oz (78.1 kg)  01/18/22 171 lb (77.6 kg)      Physical Exam Vitals and nursing note reviewed.  Constitutional:      Appearance: Normal appearance.  HENT:     Right Ear: Tympanic membrane, ear canal and external ear normal.     Left Ear: Tympanic membrane, ear canal and external ear normal.     Mouth/Throat:     Mouth:  Mucous membranes are moist.     Pharynx: Oropharynx is clear.  Eyes:     Extraocular Movements: Extraocular movements intact.     Pupils: Pupils are equal, round, and reactive to light.  Cardiovascular:     Rate and Rhythm: Normal rate and regular rhythm.     Pulses: Normal pulses.     Heart sounds: Normal heart sounds.  Pulmonary:     Effort: Pulmonary effort is normal.     Breath sounds: Normal breath sounds.  Abdominal:     Palpations: There is no mass.  Musculoskeletal:     Right lower leg: No edema.     Left lower leg: No edema.  Lymphadenopathy:     Cervical: No cervical adenopathy.  Skin:    General: Skin is warm.  Neurological:     General: No focal deficit present.     Mental Status: He is alert.     Deep Tendon Reflexes:     Reflex Scores:      Bicep reflexes are 2+ on the right side and 2+ on the left side.      Patellar reflexes are 2+ on the right side and 2+ on the left side.    Comments: Bilateral upper and lower extremity  strength 5/5  Psychiatric:        Mood and Affect: Mood normal.        Behavior: Behavior normal.        Thought Content: Thought content normal.        Judgment: Judgment normal.      No results found for any visits on 07/22/22.    The ASCVD Risk score (Arnett DK, et al., 2019) failed to calculate for the following reasons:   The valid total cholesterol range is 130 to 320 mg/dL   Unable to determine if patient is Non-Hispanic African American    Assessment & Plan:   Problem List Items Addressed This Visit       Cardiovascular and Mediastinum   Essential hypertension - Primary    Patient currently on amlodipine 10 mg daily.  Blood pressure within normal limits.  Patient tolerates medication well.  Continue      Relevant Orders   CBC   Comprehensive metabolic panel   TSH   Atherosclerosis    Patient currently maintained on atorvastatin 10 mg.  Tolerates medication well.  Pending lipid panel today.  Patient is also still  smoking.      Relevant Orders   Lipid panel     Respiratory   COPD (chronic obstructive pulmonary disease) ([facility])    Patient still smoking tolerates Wixela well states albuterol inhaler 3 times in the past 6 months patient denies PND        Other   History of prostate cancer    History of the same.  Has been cleared from urology pending PSA today      Relevant Orders   PSA, Medicare   Tobacco use    Will check urine microscopy for microscopic hematuria in setting of tobacco abuse      Relevant Orders   Urine Microscopic   Other Visit Diagnoses     Colonoscopy refused       Screening for prostate cancer       Relevant Orders   PSA, Medicare       Return in about 1 year (around 07/22/2023) for CPE and Labs.    Audria Nine, NP

## 2022-07-22 NOTE — Patient Instructions (Signed)
Nice to see you today  I will be in touch with the labs once I have reviewed them Follow up with me in a year, sooner if you need me

## 2022-07-22 NOTE — Assessment & Plan Note (Signed)
Patient still smoking tolerates Wixela well states albuterol inhaler 3 times in the past 6 months patient denies PND

## 2022-07-23 ENCOUNTER — Telehealth: Payer: Self-pay | Admitting: Nurse Practitioner

## 2022-07-23 LAB — CBC
HCT: 42.7 % (ref 39.0–52.0)
Hemoglobin: 14.2 g/dL (ref 13.0–17.0)
MCHC: 33.3 g/dL (ref 30.0–36.0)
MCV: 91.8 fl (ref 78.0–100.0)
Platelets: 271 10*3/uL (ref 150.0–400.0)
RBC: 4.65 Mil/uL (ref 4.22–5.81)
RDW: 14.9 % (ref 11.5–15.5)
WBC: 9.4 10*3/uL (ref 4.0–10.5)

## 2022-07-23 LAB — URINALYSIS, MICROSCOPIC ONLY
RBC / HPF: NONE SEEN (ref 0–?)
WBC, UA: NONE SEEN (ref 0–?)

## 2022-07-23 LAB — LIPID PANEL
Cholesterol: 110 mg/dL (ref 0–200)
HDL: 41.4 mg/dL (ref >39.00)
LDL Cholesterol: 57 mg/dL (ref 0–99)
NonHDL: 68.56
Total CHOL/HDL Ratio: 3
Triglycerides: 59 mg/dL (ref 0.0–149.0)
VLDL: 11.8 mg/dL (ref 0.0–40.0)

## 2022-07-23 LAB — COMPREHENSIVE METABOLIC PANEL
ALT: 7 U/L (ref 0–53)
AST: 9 U/L (ref 0–37)
Albumin: 4.2 g/dL (ref 3.5–5.2)
Alkaline Phosphatase: 56 U/L (ref 39–117)
BUN: 14 mg/dL (ref 6–23)
CO2: 26 mEq/L (ref 19–32)
Calcium: 9.6 mg/dL (ref 8.4–10.5)
Chloride: 105 mEq/L (ref 96–112)
Creatinine, Ser: 0.8 mg/dL (ref 0.40–1.50)
GFR: 84.73 mL/min (ref >60.00)
Glucose, Bld: 90 mg/dL (ref 70–99)
Potassium: 4.4 mEq/L (ref 3.5–5.1)
Sodium: 140 mEq/L (ref 135–145)
Total Bilirubin: 0.8 mg/dL (ref 0.2–1.2)
Total Protein: 6.8 g/dL (ref 6.0–8.3)

## 2022-07-23 LAB — PSA, MEDICARE: PSA: 0.07 ng/ml — ABNORMAL LOW (ref 0.10–4.00)

## 2022-07-23 LAB — TSH: TSH: 1.84 u[IU]/mL (ref 0.35–5.50)

## 2022-07-23 NOTE — Telephone Encounter (Signed)
Nothing to do to get it within normal range. This is likely because of the treatments that he underwent when he had prostate cancer. We will only be concerned with an elevated number not the low number

## 2022-07-23 NOTE — Telephone Encounter (Signed)
Left voicemail for patient to call the office back.   

## 2022-07-23 NOTE — Telephone Encounter (Signed)
-----   Message from [place], [place] sent at 07/23/2022  1:14 PM EDT ----- Called patient reviewed all information and repeated back to me. Patient's wife had a question about the low prostate number. She wants to know what they can do to help get it back to normal.

## 2022-07-24 NOTE — Telephone Encounter (Signed)
Called patient reviewed all information and repeated back to me. Will call if any questions.   Melina Copa, CMA

## 2022-07-30 ENCOUNTER — Telehealth: Payer: Self-pay | Admitting: Nurse Practitioner

## 2022-07-30 NOTE — Telephone Encounter (Signed)
noted 

## 2022-07-30 NOTE — Telephone Encounter (Signed)
Contacted Christian Gordon to schedule their annual wellness visit. Patient declined to schedule AWV at this time. Please discuss with patient at next office visit.  [facility] Care Guide [facility] AWV TEAM Direct Dial: [phone]

## 2022-08-11 ENCOUNTER — Other Ambulatory Visit: Payer: Self-pay | Admitting: Nurse Practitioner

## 2022-08-11 DIAGNOSIS — R3915 Urgency of urination: Secondary | ICD-10-CM

## 2022-09-10 ENCOUNTER — Other Ambulatory Visit: Payer: Self-pay | Admitting: Nurse Practitioner

## 2022-09-10 DIAGNOSIS — J449 Chronic obstructive pulmonary disease, unspecified: Secondary | ICD-10-CM

## 2022-09-16 ENCOUNTER — Other Ambulatory Visit: Payer: Self-pay | Admitting: Nurse Practitioner

## 2022-09-16 DIAGNOSIS — E782 Mixed hyperlipidemia: Secondary | ICD-10-CM

## 2022-09-16 DIAGNOSIS — I10 Essential (primary) hypertension: Secondary | ICD-10-CM

## 2022-09-18 ENCOUNTER — Ambulatory Visit (INDEPENDENT_AMBULATORY_CARE_PROVIDER_SITE_OTHER): Payer: Medicare HMO | Admitting: Podiatry

## 2022-09-18 ENCOUNTER — Encounter: Payer: Self-pay | Admitting: Podiatry

## 2022-09-18 DIAGNOSIS — M79675 Pain in left toe(s): Secondary | ICD-10-CM

## 2022-09-18 DIAGNOSIS — B351 Tinea unguium: Secondary | ICD-10-CM | POA: Diagnosis not present

## 2022-09-18 DIAGNOSIS — M79674 Pain in right toe(s): Secondary | ICD-10-CM

## 2022-09-18 NOTE — Progress Notes (Signed)
  Subjective:  Patient ID: Christian Gordon, male    DOB: [birth year],  MRN: [id number]  Chief Complaint  Patient presents with   Nail Problem    "Routine trimming of my nails."    77 y.o. male presents with the above complaint. History confirmed with patient.  His nails are thickened elongated and causing discomfort.    Objective:  Physical Exam: warm, good capillary refill, no trophic changes or ulcerative lesions, normal DP and PT pulses, and normal sensory exam. Left Foot: dystrophic yellowed discolored nail plates with subungual debris Right Foot: dystrophic yellowed discolored nail plates with subungual debris  Assessment:   1. Pain due to onychomycosis of toenails of both feet      Plan:  Patient was evaluated and treated and all questions answered.  Discussed the etiology and treatment options for the condition in detail with the patient. Educated patient on the topical and oral treatment options for mycotic nails. Recommended debridement of the nails today. Sharp and mechanical debridement performed of all painful and mycotic nails today. Nails debrided in length and thickness using a nail nipper to level of comfort. Discussed treatment options including appropriate shoe gear. Follow up as needed for painful nails.   Return in about 3 months (around 12/19/2022) for RFC.

## 2022-09-20 IMAGING — CT CT CHEST LUNG CANCER SCREENING LOW DOSE W/O CM
2 of 5 series · 15 of 40 positions shown, 18 images · non-contrast
Comparison: CT chest dated July 21, 2019

CLINICAL DATA: Former smoker with 60 pack-year history

EXAM:
CT CHEST WITHOUT CONTRAST LOW-DOSE FOR LUNG CANCER SCREENING
TECHNIQUE: Multidetector CT imaging of the chest was performed following the
standard protocol without IV contrast.

[Series 3: lung 1.00 · axial · 0.70mm/px · z∈[-1235,-915]mm · 12 of 354 slices shown, 15 images]
[im 17/354  mediastinal]
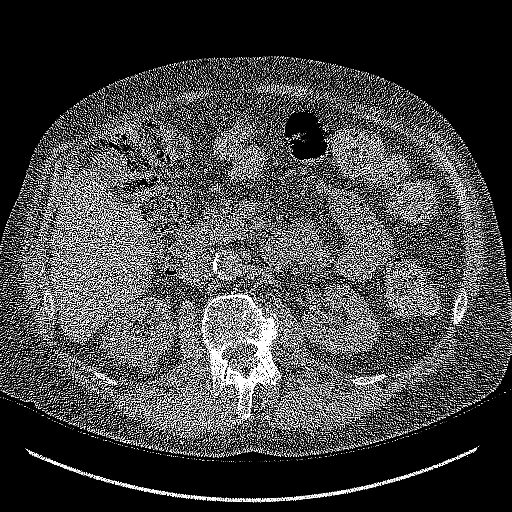
[im 17/354  lung]
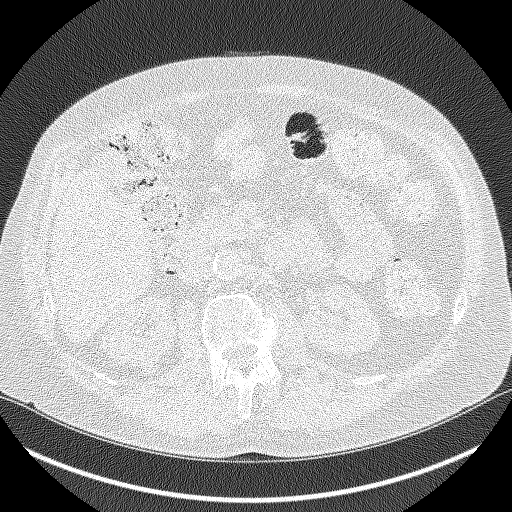
[im 49/354  lung]
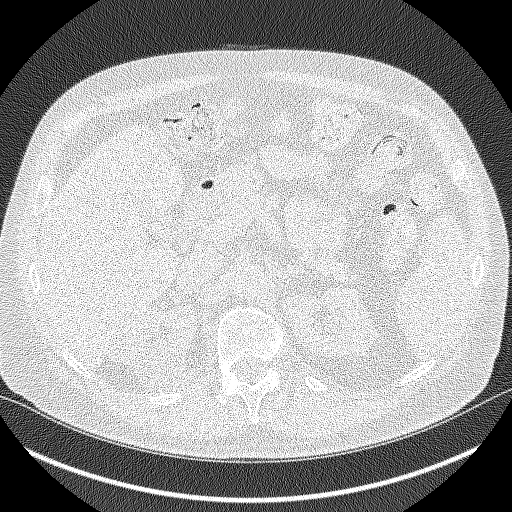
[im 81/354  lung]
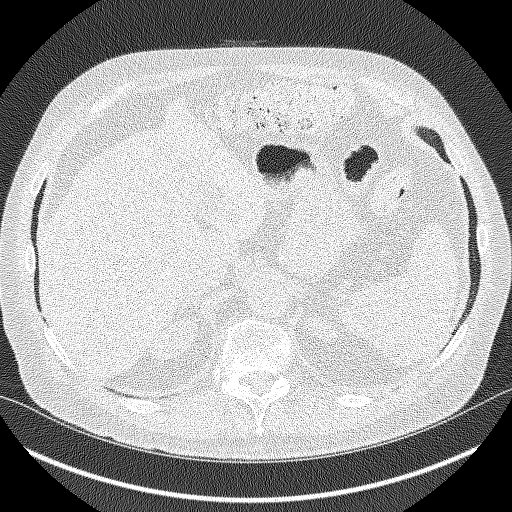
[im 113/354  lung]
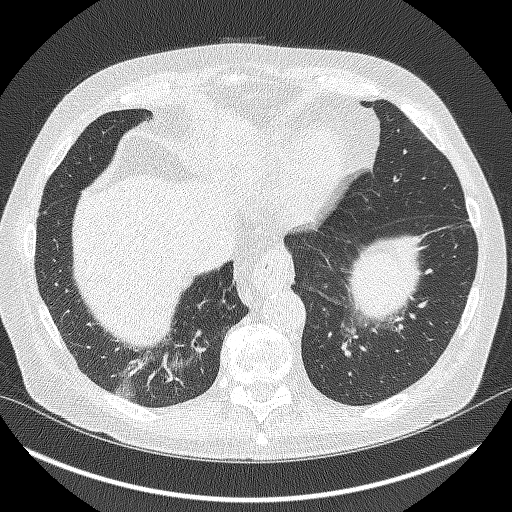
[im 129/354  mediastinal]
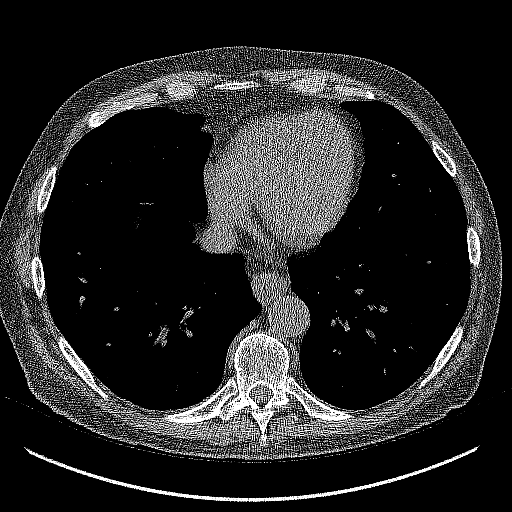
[im 129/354  lung]
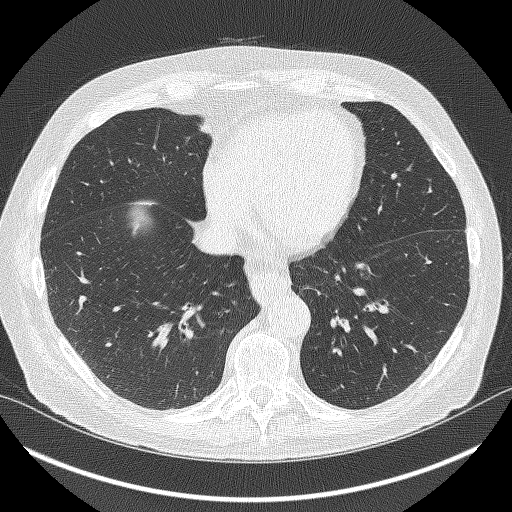
[im 161/354  lung]
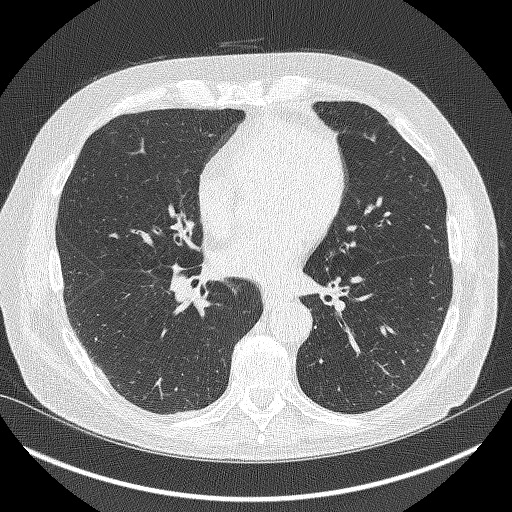
[im 193/354  lung]
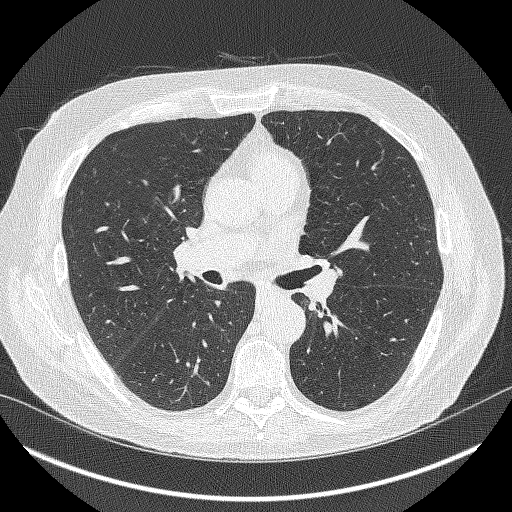
[im 225/354  lung]
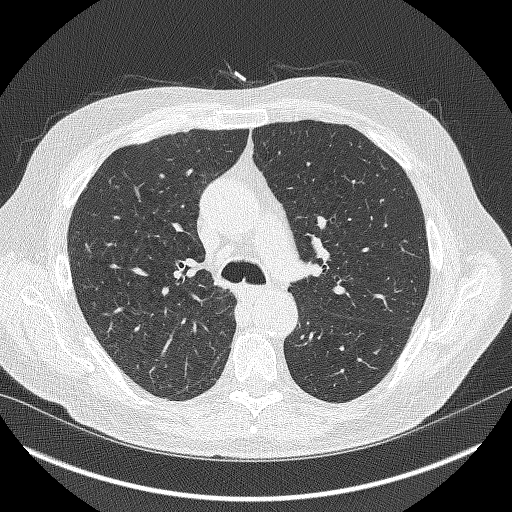
[im 241/354  mediastinal]
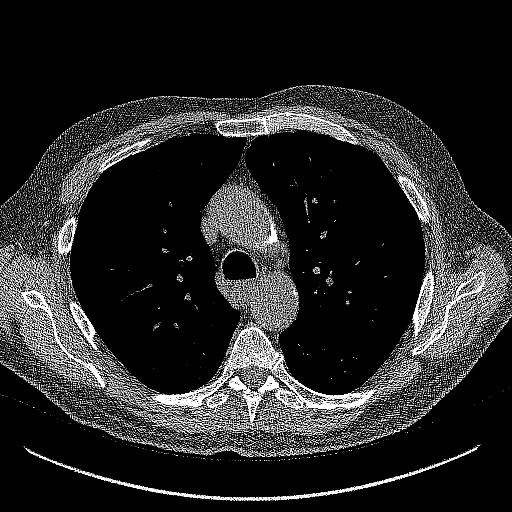
[im 241/354  lung]
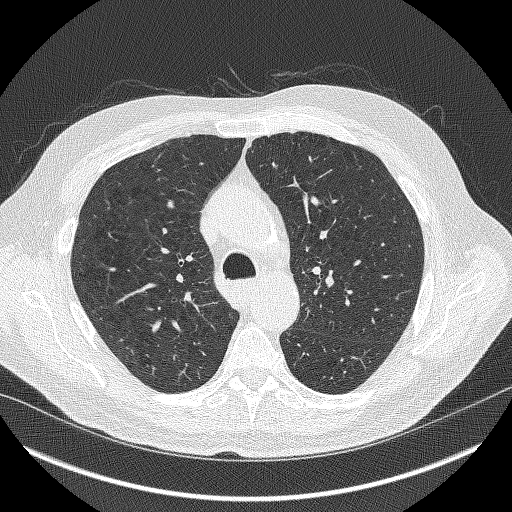
[im 273/354  lung]
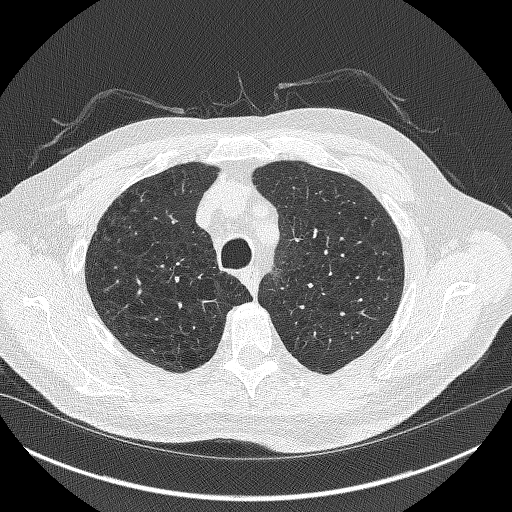
[im 305/354  lung]
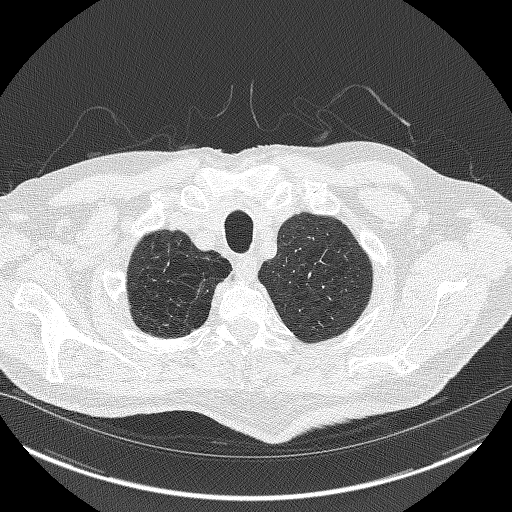
[im 337/354  lung]
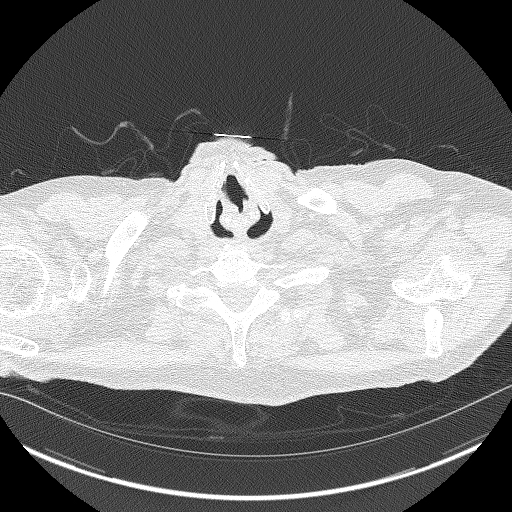

[Series 5: coronals lung 1.00 cor · coronal · 0.69mm/px · 3 of 297 slices shown]
[im 60/297  lung]
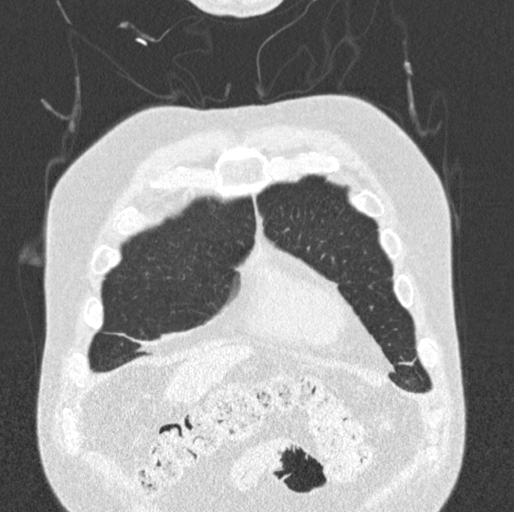
[im 119/297  lung]
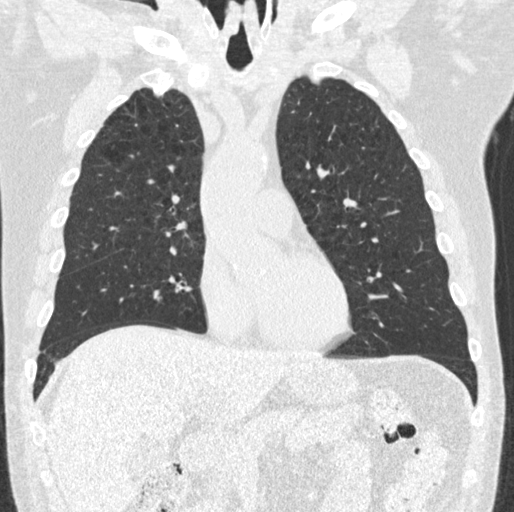
[im 178/297  lung]
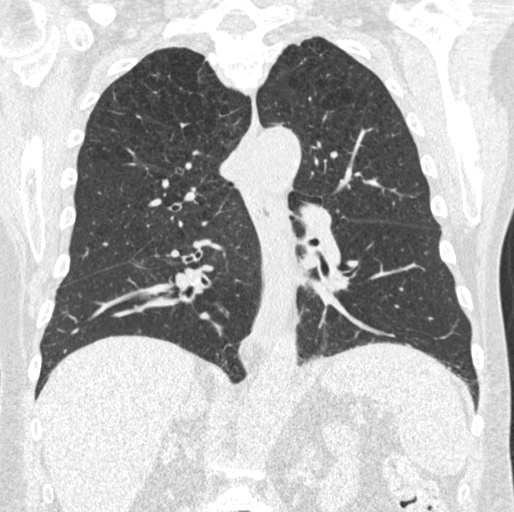

[15 of 40 positions shown; findings below may reference images not displayed]

FINDINGS: Cardiovascular: Normal heart size. No pericardial effusion. No
significant coronary artery calcifications. Atherosclerotic disease
of the thoracic aorta.

Mediastinum/Nodes: Small hiatal hernia. Thyroid is unremarkable. No
pathologically enlarged lymph nodes seen in the chest.

Lungs/Pleura: Central airways are patent. Mild bilateral bronchial
wall thickening. Centrilobular emphysema. Right lower lobe
atelectasis. No consolidation pleural effusion or pneumothorax.
Stable small solid pulmonary nodules measuring less than 4 mm.

Upper Abdomen: Low-density lesion of the left hepatic lobe,
unchanged compared prior and likely a simple cyst. Probable simple
cyst of the right kidney. No acute abnormality.

Musculoskeletal: No chest wall mass or suspicious bone lesions
identified.
IMPRESSION: 1. Lung-RADS 2, benign appearance or behavior. Continue annual
screening with low-dose chest CT without contrast in 12 months.
2. Aortic Atherosclerosis (8PW3B-KMK.K) and Emphysema (8PW3B-IE9.7).

## 2022-11-28 ENCOUNTER — Other Ambulatory Visit: Payer: Self-pay | Admitting: Acute Care

## 2022-11-28 DIAGNOSIS — F17210 Nicotine dependence, cigarettes, uncomplicated: Secondary | ICD-10-CM

## 2022-11-28 DIAGNOSIS — Z87891 Personal history of nicotine dependence: Secondary | ICD-10-CM

## 2022-11-28 DIAGNOSIS — Z122 Encounter for screening for malignant neoplasm of respiratory organs: Secondary | ICD-10-CM

## 2022-12-15 ENCOUNTER — Other Ambulatory Visit: Payer: Self-pay | Admitting: Nurse Practitioner

## 2022-12-15 DIAGNOSIS — J449 Chronic obstructive pulmonary disease, unspecified: Secondary | ICD-10-CM

## 2022-12-23 ENCOUNTER — Ambulatory Visit: Payer: Medicare HMO

## 2022-12-25 ENCOUNTER — Encounter: Payer: Self-pay | Admitting: Podiatry

## 2022-12-25 ENCOUNTER — Ambulatory Visit (INDEPENDENT_AMBULATORY_CARE_PROVIDER_SITE_OTHER): Payer: Medicare HMO | Admitting: Podiatry

## 2022-12-25 VITALS — Ht 69.0 in | Wt 170.0 lb

## 2022-12-25 DIAGNOSIS — M79675 Pain in left toe(s): Secondary | ICD-10-CM | POA: Diagnosis not present

## 2022-12-25 DIAGNOSIS — M79674 Pain in right toe(s): Secondary | ICD-10-CM

## 2022-12-25 DIAGNOSIS — B351 Tinea unguium: Secondary | ICD-10-CM | POA: Diagnosis not present

## 2022-12-25 NOTE — Progress Notes (Signed)
  Subjective:  Patient ID: Christian Gordon, male    DOB: 1945/11/01,  MRN: [id number]  Chief Complaint  Patient presents with   Nail Problem    Patient is here for RFC    77 y.o. male presents with the above complaint. History confirmed with patient.  His nails are thickened elongated and causing discomfort.  Prior debridements have been helpful in reducing pain and improving function  Objective:  Physical Exam: warm, good capillary refill, no trophic changes or ulcerative lesions, normal DP and PT pulses, and normal sensory exam. Left Foot: dystrophic yellowed discolored nail plates with subungual debris Right Foot: dystrophic yellowed discolored nail plates with subungual debris  Assessment:   1. Pain due to onychomycosis of toenails of both feet      Plan:  Patient was evaluated and treated and all questions answered.  Discussed the etiology and treatment options for the condition in detail with the patient. . Recommended debridement of the nails today. Sharp and mechanical debridement performed of all painful and mycotic nails today. Nails debrided in length and thickness using a nail nipper to level of comfort. Discussed treatment options including appropriate shoe gear.    Return in about 3 months (around 03/25/2023) for painful thick fungal nails.

## 2023-02-14 ENCOUNTER — Other Ambulatory Visit: Payer: Self-pay | Admitting: Nurse Practitioner

## 2023-02-14 DIAGNOSIS — R3915 Urgency of urination: Secondary | ICD-10-CM

## 2023-03-04 ENCOUNTER — Ambulatory Visit (INDEPENDENT_AMBULATORY_CARE_PROVIDER_SITE_OTHER): Payer: Medicare HMO | Admitting: Nurse Practitioner

## 2023-03-04 ENCOUNTER — Ambulatory Visit (INDEPENDENT_AMBULATORY_CARE_PROVIDER_SITE_OTHER)
Admission: RE | Admit: 2023-03-04 | Discharge: 2023-03-04 | Disposition: A | Discharge status: Home / Self Care | Payer: Medicare HMO | Source: Ambulatory Visit | Attending: Nurse Practitioner

## 2023-03-04 VITALS — BP 124/78 | HR 75 | Temp 97.5°F | Ht 69.0 in | Wt 163.6 lb

## 2023-03-04 DIAGNOSIS — J449 Chronic obstructive pulmonary disease, unspecified: Secondary | ICD-10-CM

## 2023-03-04 DIAGNOSIS — R051 Acute cough: Secondary | ICD-10-CM

## 2023-03-04 DIAGNOSIS — J441 Chronic obstructive pulmonary disease with (acute) exacerbation: Secondary | ICD-10-CM | POA: Insufficient documentation

## 2023-03-04 DIAGNOSIS — Z122 Encounter for screening for malignant neoplasm of respiratory organs: Secondary | ICD-10-CM | POA: Diagnosis not present

## 2023-03-04 DIAGNOSIS — Z720 Tobacco use: Secondary | ICD-10-CM

## 2023-03-04 DIAGNOSIS — R42 Dizziness and giddiness: Secondary | ICD-10-CM | POA: Insufficient documentation

## 2023-03-04 MED ORDER — TRIAMCINOLONE ACETONIDE 0.1 % EX CREA: 1.0000 | TOPICAL_CREAM | Freq: Two times a day (BID) | CUTANEOUS | 0 refills | Status: DC

## 2023-03-04 MED ORDER — FLUTICASONE-SALMETEROL 250-50 MCG/ACT IN AEPB: 1.0000 | INHALATION_SPRAY | Freq: Two times a day (BID) | RESPIRATORY_TRACT | 5 refills | Status: DC

## 2023-03-04 MED ORDER — AMOXICILLIN-POT CLAVULANATE 875-125 MG PO TABS: 1.0000 | ORAL_TABLET | Freq: Two times a day (BID) | ORAL | 0 refills | Status: AC

## 2023-03-04 NOTE — Assessment & Plan Note (Signed)
Multifactorial.  Patient was taking 4 tablets of 0.4 mg tamsulosin.  Likely the culprit of the lightheadedness.  Encourage patient to take medication as prescribed which is 0.8 mg daily.

## 2023-03-04 NOTE — Progress Notes (Signed)
Acute Office Visit  Subjective:     Patient ID: Christian Gordon, male    DOB: 1945/12/29, 78 y.o.   MRN: [id number]  Chief Complaint  Patient presents with   Nasal Congestion    Pt complains of congestion in nose and chest that started 3 weeks ago. Pt states his chest feels better but nose is still congested. Pt also complains of cough with phlegm and lightheadedness.    Medication Refill    Wixela inhaler and Kenalog cream.      Patient is in today for sick symptoms with a history of HTN, COPD, HLD, tobacco abuse   Symptoms started approx 3 weeks ago States that he is feeling a little better than he did yesterday.  He has been using vicks inhaler and salines Sick contacts that was his wife  Flu vaccine utd Covid utd   He has been using asa, nyquill, cold tablets. States that it has helped some   Review of Systems  Constitutional:  Positive for malaise/fatigue. Negative for chills and fever.  HENT:  Positive for congestion. Negative for ear discharge, ear pain, nosebleeds and sore throat.   Respiratory:  Positive for cough and sputum production. Negative for shortness of breath.   Gastrointestinal:  Negative for abdominal pain, diarrhea, nausea and vomiting.  Musculoskeletal:  Negative for joint pain and myalgias.  Neurological:  Positive for dizziness (lightheaded when he is moving).        Objective:    BP 124/78   Pulse 75   Temp (!) 97.5 F (36.4 C) (Oral)   Ht 5\' 9"  (1.753 m)   Wt 163 lb 9.6 oz (74.2 kg)   SpO2 96%   BMI 24.16 kg/m    Physical Exam Vitals and nursing note reviewed.  Constitutional:      Appearance: Normal appearance.  HENT:     Right Ear: Tympanic membrane, ear canal and external ear normal.     Left Ear: Tympanic membrane, ear canal and external ear normal.     Ears:     Comments: Clear fluid behind bilateral TM    Nose:     Right Sinus: No maxillary sinus tenderness or frontal sinus tenderness.     Left Sinus: No maxillary sinus  tenderness or frontal sinus tenderness.     Mouth/Throat:     Mouth: Mucous membranes are moist.     Pharynx: Oropharynx is clear.  Cardiovascular:     Rate and Rhythm: Regular rhythm. Tachycardia present.     Heart sounds: Normal heart sounds.  Pulmonary:     Effort: Pulmonary effort is normal.     Breath sounds: Normal breath sounds.  Lymphadenopathy:     Cervical: No cervical adenopathy.  Neurological:     Mental Status: He is alert.     No results found for any visits on 03/04/23.      Assessment & Plan:   Problem List Items Addressed This Visit       Respiratory   COPD (chronic obstructive pulmonary disease) (HCC)   Refilled Wixela      Relevant Medications   fluticasone-salmeterol (WIXELA INHUB) 250-50 MCG/ACT AEPB   COPD exacerbation (HCC)   Will treat patient with Augmentin pending chest x-ray.        Relevant Medications   amoxicillin-clavulanate (AUGMENTIN) 875-125 MG tablet   fluticasone-salmeterol (WIXELA INHUB) 250-50 MCG/ACT AEPB   Other Relevant Orders   DG Chest 2 View     Other   Tobacco use - Primary  Relevant Orders   Ambulatory Referral Lung Cancer Screening [facility]   DG Chest 2 View   Lightheadedness   Multifactorial.  Patient was taking 4 tablets of 0.4 mg tamsulosin.  Likely the culprit of the lightheadedness.  Encourage patient to take medication as prescribed which is 0.8 mg daily.      Acute cough   Chest x-ray in office.  Patient will have the window for acute testing.      Relevant Orders   DG Chest 2 View   Other Visit Diagnoses       Screening for lung cancer       Relevant Orders   Ambulatory Referral Lung Cancer Screening [facility]       Meds ordered this encounter  Medications   amoxicillin-clavulanate (AUGMENTIN) 875-125 MG tablet    Sig: Take 1 tablet by mouth 2 (two) times daily for 7 days.    Dispense:  14 tablet    Refill:  0    Supervising Provider:   Roxy Manns A [[id number]]    fluticasone-salmeterol (WIXELA INHUB) 250-50 MCG/ACT AEPB    Sig: Inhale 1 puff into the lungs 2 (two) times daily. in the morning and at bedtime.    Dispense:  60 each    Refill:  5    Supervising Provider:   Roxy Manns A [[id number]]   triamcinolone cream (KENALOG) 0.1 %    Sig: Apply 1 Application topically 2 (two) times daily.    Dispense:  80 g    Refill:  0    Supervising Provider:   Roxy Manns A [[id number]]    Return if symptoms worsen or fail to improve, for As scheduled .  Audria Nine, NP

## 2023-03-04 NOTE — Assessment & Plan Note (Signed)
Chest x-ray in office.  Patient will have the window for acute testing.

## 2023-03-04 NOTE — Assessment & Plan Note (Signed)
Refilled [employer]

## 2023-03-04 NOTE — Assessment & Plan Note (Signed)
Will treat patient with Augmentin pending chest x-ray.

## 2023-03-04 NOTE — Patient Instructions (Addendum)
Nice to see you today I have refilled your medications and sent an antibiotic to the pharmacy Follow up with me as scheduled or if you do not improve   ONLY take 2 capsule of the tamsulosin a day

## 2023-03-13 ENCOUNTER — Other Ambulatory Visit: Payer: Self-pay | Admitting: Nurse Practitioner

## 2023-03-13 DIAGNOSIS — E782 Mixed hyperlipidemia: Secondary | ICD-10-CM

## 2023-03-13 DIAGNOSIS — I10 Essential (primary) hypertension: Secondary | ICD-10-CM

## 2023-03-14 NOTE — Telephone Encounter (Signed)
 LAST APPOINTMENT DATE: 03/04/2023 cough   07/22/22 new patient advised follow up 1 year    NEXT APPOINTMENT DATE: 07/23/2023 CPE     LAST REFILL: 09/17/22  QTY: #90 1 rf

## 2023-03-26 ENCOUNTER — Ambulatory Visit (INDEPENDENT_AMBULATORY_CARE_PROVIDER_SITE_OTHER): Payer: Medicare HMO | Admitting: Podiatry

## 2023-03-26 ENCOUNTER — Encounter: Payer: Self-pay | Admitting: Podiatry

## 2023-03-26 DIAGNOSIS — B351 Tinea unguium: Secondary | ICD-10-CM

## 2023-03-26 DIAGNOSIS — M79674 Pain in right toe(s): Secondary | ICD-10-CM | POA: Diagnosis not present

## 2023-03-26 DIAGNOSIS — M79675 Pain in left toe(s): Secondary | ICD-10-CM

## 2023-03-26 NOTE — Progress Notes (Signed)
  Subjective:  Patient ID: Christian Gordon, male    DOB: 02/01/1945,  MRN: [id number]  Chief Complaint  Patient presents with   Nail Problem    "Cut my nails."    78 y.o. male presents with the above complaint. History confirmed with patient.  His nails are thickened elongated and causing discomfort.  Prior debridements have been helpful in reducing pain and improving function  Objective:  Physical Exam: warm, good capillary refill, no trophic changes or ulcerative lesions, normal DP and PT pulses, and normal sensory exam. Left Foot: dystrophic yellowed discolored nail plates with subungual debris Right Foot: dystrophic yellowed discolored nail plates with subungual debris  Assessment:   1. Pain due to onychomycosis of toenails of both feet      Plan:  Patient was evaluated and treated and all questions answered.  Discussed the etiology and treatment options for the condition in detail with the patient. . Recommended debridement of the nails today. Sharp and mechanical debridement performed of all painful and mycotic nails today. Nails debrided in length and thickness using a nail nipper to level of comfort. Discussed treatment options including appropriate shoe gear.    Return in about 3 months (around 06/26/2023) for painful thick fungal nails.

## 2023-04-07 ENCOUNTER — Other Ambulatory Visit: Payer: Self-pay | Admitting: Family Medicine

## 2023-06-06 ENCOUNTER — Ambulatory Visit: Payer: Self-pay

## 2023-06-06 NOTE — Telephone Encounter (Signed)
 Referrals require an office note to be attached with them.  Most times you do not need a referral to an eye center he can call and make an appointment on his own if he feels like he does not need to be evaluated.  Recommend office visit to make sure nothing else is going on

## 2023-06-06 NOTE — Telephone Encounter (Signed)
  Chief Complaint: eye irritation Symptoms: eye irritation Frequency: last night Pertinent Negatives: Patient denies swelling Disposition: [] ED /[] Urgent Care (no appt availability in office) / [x] Appointment(In office/virtual)/ []  [place] [facility]/ [x] Home Care/ [x] Refused Recommended Disposition /[]  Mobile Bus/ [x]  Follow-up with PCP  Additional Notes: Pt states that the irration started last night and his eye lashes curved up and are making contacting eyeballs. Pt states that his happens often and he is needing a referral to the eye center.  Pt states that he did not contact they eye place. States that he need to get the lashes taken out there. States eyes just red from rubbing but no swelling or discharge.   Copied from [id number]. Topic: Clinical - Medical Advice >> Jun 06, 2023  2:15 PM Jenice Mitts wrote: Reason for CRM:  patient is calling because eyelashes rubbing against his eye and would like to know the next step or if he needs to go see an eye doctor. States the doesn't want to come in as there is nothing that can be done here. He just wants a referral place today.   can be reached at number on file Reason for Disposition  MODERATE eye pain or discomfort (e.g., interferes with normal activities or awakens from sleep; more than mild)  Protocols used: Eye Pain and Other Symptoms-A-AH

## 2023-06-06 NOTE — Telephone Encounter (Signed)
 Contacted pt. Gave him contact information for eye center to set up appointment.  Pt declines evaluation from PCP.  States he has this problem often with long lashes.

## 2023-06-10 ENCOUNTER — Ambulatory Visit: Admitting: Nurse Practitioner

## 2023-06-30 ENCOUNTER — Ambulatory Visit (INDEPENDENT_AMBULATORY_CARE_PROVIDER_SITE_OTHER): Admitting: Podiatry

## 2023-06-30 ENCOUNTER — Encounter: Payer: Self-pay | Admitting: Podiatry

## 2023-06-30 DIAGNOSIS — M79674 Pain in right toe(s): Secondary | ICD-10-CM

## 2023-06-30 DIAGNOSIS — M79675 Pain in left toe(s): Secondary | ICD-10-CM

## 2023-06-30 DIAGNOSIS — B351 Tinea unguium: Secondary | ICD-10-CM | POA: Diagnosis not present

## 2023-06-30 NOTE — Progress Notes (Signed)
  Subjective:  Patient ID: Christian Gordon, male    DOB: [birth year],  MRN: [id number]  Chief Complaint  Patient presents with   Nail Problem    "Just cutting toenails."    78 y.o. male presents with the above complaint. History confirmed with patient.  His nails are thickened elongated and causing discomfort.  Prior debridements have been helpful in reducing pain and improving function  Objective:  Physical Exam: warm, good capillary refill, no trophic changes or ulcerative lesions, normal DP and PT pulses, and normal sensory exam. Left Foot: dystrophic yellowed discolored nail plates with subungual debris Right Foot: dystrophic yellowed discolored nail plates with subungual debris  Assessment:   1. Pain due to onychomycosis of toenails of both feet      Plan:  Patient was evaluated and treated and all questions answered.  Discussed the etiology and treatment options for the condition in detail with the patient. . Recommended debridement of the nails today. Sharp and mechanical debridement performed of all painful and mycotic nails today. Nails debrided in length and thickness using a nail nipper to level of comfort. Discussed treatment options including appropriate shoe gear.    Return in about 3 months (around 09/30/2023) for painful thick fungal nails.

## 2023-07-23 ENCOUNTER — Encounter: Payer: Medicare HMO | Admitting: Nurse Practitioner

## 2023-07-31 ENCOUNTER — Encounter: Payer: Self-pay | Admitting: Nurse Practitioner

## 2023-07-31 ENCOUNTER — Ambulatory Visit (INDEPENDENT_AMBULATORY_CARE_PROVIDER_SITE_OTHER)
Admission: RE | Admit: 2023-07-31 | Discharge: 2023-07-31 | Disposition: A | Discharge status: Home / Self Care | Source: Ambulatory Visit | Attending: Nurse Practitioner | Admitting: Nurse Practitioner

## 2023-07-31 ENCOUNTER — Ambulatory Visit (INDEPENDENT_AMBULATORY_CARE_PROVIDER_SITE_OTHER): Admitting: Nurse Practitioner

## 2023-07-31 VITALS — BP 146/72 | HR 73 | Temp 97.9°F | Ht 69.0 in | Wt 163.4 lb

## 2023-07-31 DIAGNOSIS — I10 Essential (primary) hypertension: Secondary | ICD-10-CM | POA: Diagnosis not present

## 2023-07-31 DIAGNOSIS — J449 Chronic obstructive pulmonary disease, unspecified: Secondary | ICD-10-CM

## 2023-07-31 DIAGNOSIS — R61 Generalized hyperhidrosis: Secondary | ICD-10-CM

## 2023-07-31 DIAGNOSIS — Z125 Encounter for screening for malignant neoplasm of prostate: Secondary | ICD-10-CM | POA: Diagnosis not present

## 2023-07-31 DIAGNOSIS — Z720 Tobacco use: Secondary | ICD-10-CM

## 2023-07-31 DIAGNOSIS — Z122 Encounter for screening for malignant neoplasm of respiratory organs: Secondary | ICD-10-CM

## 2023-07-31 DIAGNOSIS — Z0000 Encounter for general adult medical examination without abnormal findings: Secondary | ICD-10-CM

## 2023-07-31 DIAGNOSIS — H9191 Unspecified hearing loss, right ear: Secondary | ICD-10-CM

## 2023-07-31 DIAGNOSIS — Z8546 Personal history of malignant neoplasm of prostate: Secondary | ICD-10-CM

## 2023-07-31 LAB — COMPREHENSIVE METABOLIC PANEL WITH GFR
ALT: 8 U/L (ref 0–53)
AST: 8 U/L (ref 0–37)
Albumin: 4.3 g/dL (ref 3.5–5.2)
Alkaline Phosphatase: 58 U/L (ref 39–117)
BUN: 13 mg/dL (ref 6–23)
CO2: 29 meq/L (ref 19–32)
Calcium: 9.3 mg/dL (ref 8.4–10.5)
Chloride: 103 meq/L (ref 96–112)
Creatinine, Ser: 0.92 mg/dL (ref 0.40–1.50)
GFR: 79.07 mL/min (ref >60.00)
Glucose, Bld: 101 mg/dL — ABNORMAL HIGH (ref 70–99)
Potassium: 4 meq/L (ref 3.5–5.1)
Sodium: 139 meq/L (ref 135–145)
Total Bilirubin: 0.7 mg/dL (ref 0.2–1.2)
Total Protein: 6.8 g/dL (ref 6.0–8.3)

## 2023-07-31 LAB — LIPID PANEL
Cholesterol: 123 mg/dL (ref 0–200)
HDL: 47.1 mg/dL (ref >39.00)
LDL Cholesterol: 58 mg/dL (ref 0–99)
NonHDL: 75.46
Total CHOL/HDL Ratio: 3
Triglycerides: 85 mg/dL (ref 0.0–149.0)
VLDL: 17 mg/dL (ref 0.0–40.0)

## 2023-07-31 LAB — PSA, MEDICARE: PSA: 0.05 ng/mL — ABNORMAL LOW (ref 0.10–4.00)

## 2023-07-31 LAB — CBC
HCT: 44 % (ref 39.0–52.0)
Hemoglobin: 14.8 g/dL (ref 13.0–17.0)
MCHC: 33.6 g/dL (ref 30.0–36.0)
MCV: 91.6 fl (ref 78.0–100.0)
Platelets: 247 K/uL (ref 150.0–400.0)
RBC: 4.81 Mil/uL (ref 4.22–5.81)
RDW: 14.5 % (ref 11.5–15.5)
WBC: 10.4 K/uL (ref 4.0–10.5)

## 2023-07-31 LAB — TSH: TSH: 1.88 u[IU]/mL (ref 0.35–5.50)

## 2023-07-31 MED ORDER — TRIAMCINOLONE ACETONIDE 0.1 % EX CREA: 1.0000 | TOPICAL_CREAM | Freq: Two times a day (BID) | CUTANEOUS | 0 refills | Status: AC

## 2023-07-31 NOTE — Patient Instructions (Signed)
 Nice to see you today I will be in touch with the labs and xrays Follow up with me in 6 months, sooner if you need me

## 2023-07-31 NOTE — Assessment & Plan Note (Signed)
 Pending urine microscopy rule out microscopic hematuria

## 2023-07-31 NOTE — Progress Notes (Signed)
 Established Patient Office Visit  Subjective   Patient ID: Christian Gordon, male    DOB: January 06, 1946  Age: 78 y.o. MRN: [id number]  Chief Complaint  Patient presents with   Annual Exam    HPI  HTN: Patient currently maintained on amlodipine  10 mg daily. He does not hcek it at home. States that some times he does forget his medication   COPD: Patient currently maintained on Wixela 1 puff twice daily along with albuterol  as needed. State that he does not use the albuterol    BPH: Patient currently maintained on tamsulosin  0.4 mg 2 capsules by mouth daily  HLD: Currently maintained on atorvastatin  10 mg daily  for complete physical and follow up of chronic conditions.  Immunizations: -Tetanus: Completed in unsure.  Update local pharmacy -Influenza: Out of season -Shingles: Completed Shingrix series -Pneumonia: Completed Prevnar 20  Diet: Fair diet. He is eating 2 meals a day and some snacks. He is drinking coffee and orange soda.  Exercise: No regular exercise.  Eye exam: needs updaitng glasses Dental exam: Completes semi-annually    Colonoscopy:  Aged out.  Patient does not want to pursue colonoscopy at this point. Lung Cancer Screening: Completed in 12/20/2021 with recall in 12 months  PSA: Due  Advanced directive: he does not have any  11- 330-4 and he will take a nap in the day. He does feel rested        Review of Systems  Constitutional:  Negative for chills and fever.  Eyes:  Negative for blurred vision.  Respiratory:  Positive for shortness of breath.   Cardiovascular:  Negative for chest pain and leg swelling.  Gastrointestinal:  Negative for abdominal pain, blood in stool, constipation, diarrhea, nausea and vomiting.       BM daily   Genitourinary:  Negative for dysuria and hematuria.       Nocutria infrequently   Neurological:  Positive for dizziness. Negative for tingling and headaches.  Psychiatric/Behavioral:  Negative for hallucinations and  suicidal ideas.       Objective:     BP (!) 146/72   Pulse 73   Temp 97.9 F (36.6 C) (Oral)   Ht 5' 9 (1.753 m)   Wt 163 lb 6.4 oz (74.1 kg)   SpO2 96%   BMI 24.13 kg/m  BP Readings from Last 3 Encounters:  07/31/23 (!) 146/72  03/04/23 124/78  07/22/22 130/82   Wt Readings from Last 3 Encounters:  07/31/23 163 lb 6.4 oz (74.1 kg)  03/04/23 163 lb 9.6 oz (74.2 kg)  12/25/22 170 lb (77.1 kg)   SpO2 Readings from Last 3 Encounters:  07/31/23 96%  03/04/23 96%  07/22/22 93%      Physical Exam Vitals and nursing note reviewed.  Constitutional:      Appearance: Normal appearance.  HENT:     Right Ear: Tympanic membrane, ear canal and external ear normal.     Left Ear: Tympanic membrane, ear canal and external ear normal.     Mouth/Throat:     Mouth: Mucous membranes are moist.     Pharynx: Oropharynx is clear.  Eyes:     Extraocular Movements: Extraocular movements intact.     Pupils: Pupils are equal, round, and reactive to light.  Cardiovascular:     Rate and Rhythm: Normal rate and regular rhythm.     Pulses: Normal pulses.     Heart sounds: Normal heart sounds.  Pulmonary:     Effort: Pulmonary effort is normal.  Breath sounds: Wheezing (LUQ) present.  Abdominal:     General: Bowel sounds are normal. There is no distension.     Palpations: There is no mass.     Tenderness: There is no abdominal tenderness.     Hernia: No hernia is present.  Musculoskeletal:     Right lower leg: Edema (R>L) present.     Left lower leg: Edema present.  Lymphadenopathy:     Cervical: No cervical adenopathy.  Skin:    General: Skin is warm.  Neurological:     General: No focal deficit present.     Mental Status: He is alert.     Deep Tendon Reflexes:     Reflex Scores:      Bicep reflexes are 2+ on the right side and 2+ on the left side.      Patellar reflexes are 2+ on the right side and 2+ on the left side.    Comments: Bilateral upper and lower extremity  strength 5/5  Psychiatric:        Mood and Affect: Mood normal.        Behavior: Behavior normal.        Thought Content: Thought content normal.        Judgment: Judgment normal.      No results found for any visits on 07/31/23.    The ASCVD Risk score (Arnett DK, et al., 2019) failed to calculate for the following reasons:   The valid total cholesterol range is 130 to 320 mg/dL   Unable to determine if patient is Non-Hispanic African American    Assessment & Plan:   Problem List Items Addressed This Visit       Cardiovascular and Mediastinum   Essential hypertension   Patient currently maintained on amlodipine  10 mg daily.  Blood pressure slightly above goal.  Continue medication as prescribed.  Patient is having some lower extremity edema      Relevant Orders   CBC   Comprehensive metabolic panel with GFR   Lipid panel   TSH     Respiratory   COPD (chronic obstructive pulmonary disease) (HCC)   Patient currently maintained on Wixela and albuterol  as needed.  Stable at this juncture patient still smoking.        Nervous and Auditory   Deafness   Right-sided stable        Other   History of prostate cancer   Pending PSA today      Tobacco abuse   Pending urine microscopy rule out microscopic hematuria      Preventative health care - Primary   Discussed age-appropriate immunizations and screening exams.  Did review patient's personal, surgical, social, family histories.  Patient up-to-date on all age-appropriate vaccinations he would like.  Get tetanus vaccine at local pharmacy.  Patient declines CRC screening.  PSA today prostate cancer screening.  Patient was given information at discharge for preventative healthcare maintenance and anticipatory guidance      Relevant Orders   CBC   Comprehensive metabolic panel with GFR   TSH   Night sweats   Ambiguous nature will check CBC, TSH, urine and chest x-ray.      Relevant Orders   CBC   Comprehensive  metabolic panel with GFR   TSH   Urinalysis w microscopic + reflex cultur   DG Chest 2 View   Other Visit Diagnoses       Screening for prostate cancer       Relevant Orders  PSA, Medicare     Screening for lung cancer       Relevant Orders   Ambulatory Referral Lung Cancer Screening [facility]       Return in about 6 months (around 01/31/2024) for BP recheck/COPD recheck .    Adina Crandall, NP

## 2023-07-31 NOTE — Assessment & Plan Note (Signed)
 Patient currently maintained on amlodipine  10 mg daily.  Blood pressure slightly above goal.  Continue medication as prescribed.  Patient is having some lower extremity edema

## 2023-07-31 NOTE — Assessment & Plan Note (Signed)
 Ambiguous nature will check CBC, TSH, urine and chest x-ray.

## 2023-07-31 NOTE — Assessment & Plan Note (Signed)
 Right-sided stable

## 2023-07-31 NOTE — Assessment & Plan Note (Signed)
 Patient currently maintained on Wixela and albuterol  as needed.  Stable at this juncture patient still smoking.

## 2023-07-31 NOTE — Assessment & Plan Note (Signed)
 Pending PSA today

## 2023-07-31 NOTE — Assessment & Plan Note (Signed)
 Discussed age-appropriate immunizations and screening exams.  Did review patient's personal, surgical, social, family histories.  Patient up-to-date on all age-appropriate vaccinations he would like.  Get tetanus vaccine at local pharmacy.  Patient declines CRC screening.  PSA today prostate cancer screening.  Patient was given information at discharge for preventative healthcare maintenance and anticipatory guidance

## 2023-08-01 LAB — URINALYSIS W MICROSCOPIC + REFLEX CULTURE
Bacteria, UA: NONE SEEN /HPF
Bilirubin Urine: NEGATIVE
Glucose, UA: NEGATIVE
Hgb urine dipstick: NEGATIVE
Hyaline Cast: NONE SEEN /LPF
Leukocyte Esterase: NEGATIVE
Nitrites, Initial: NEGATIVE
Protein, ur: NEGATIVE
RBC / HPF: NONE SEEN /HPF (ref 0–2)
Specific Gravity, Urine: 1.024 (ref 1.001–1.035)
Squamous Epithelial / HPF: NONE SEEN /HPF (ref <=5)
WBC, UA: NONE SEEN /HPF (ref 0–5)
pH: 5.5 (ref 5.0–8.0)

## 2023-08-01 LAB — NO CULTURE INDICATED

## 2023-08-05 ENCOUNTER — Ambulatory Visit: Payer: Self-pay | Admitting: Nurse Practitioner

## 2023-08-05 NOTE — Telephone Encounter (Signed)
 Called patient reviewed all information and repeated back to me. Will call if any questions.    Pt complains of ongoing swollen legs and mentions the canceled lung screening referral due to aging out of eligibility. Medicare stops coverage after 80 years of age.

## 2023-08-06 NOTE — Telephone Encounter (Signed)
 I got the same message and messaged the team back about it and am waiting to hear back

## 2023-08-06 NOTE — Telephone Encounter (Signed)
-----   Message from [place], [place] sent at 08/05/2023  4:10 PM EDT -----   ----- Message ----- From: Wendee Lynwood HERO, NP Sent: 08/05/2023   7:42 AM EDT To: Wendee Gunnels  Can we let the patient know that his urine came back looking ok and the chest xray did not show anything of concern. If he starts to lose weight or the night sweats do not improve please tell him to  let me know ----- Message ----- From: Interface, Rad Results In Sent: 08/02/2023  10:24 PM EDT To: Lynwood HERO Wendee, NP

## 2023-08-08 NOTE — Telephone Encounter (Signed)
Letter mailed to pt address on file.

## 2023-08-10 ENCOUNTER — Other Ambulatory Visit: Payer: Self-pay | Admitting: Nurse Practitioner

## 2023-08-10 DIAGNOSIS — R3915 Urgency of urination: Secondary | ICD-10-CM

## 2023-09-13 ENCOUNTER — Other Ambulatory Visit: Payer: Self-pay | Admitting: Nurse Practitioner

## 2023-09-13 DIAGNOSIS — I10 Essential (primary) hypertension: Secondary | ICD-10-CM

## 2023-09-13 DIAGNOSIS — E782 Mixed hyperlipidemia: Secondary | ICD-10-CM

## 2023-10-01 ENCOUNTER — Ambulatory Visit (INDEPENDENT_AMBULATORY_CARE_PROVIDER_SITE_OTHER): Admitting: Podiatry

## 2023-10-01 VITALS — Ht 69.0 in | Wt 163.4 lb

## 2023-10-01 DIAGNOSIS — M79674 Pain in right toe(s): Secondary | ICD-10-CM

## 2023-10-01 DIAGNOSIS — B351 Tinea unguium: Secondary | ICD-10-CM

## 2023-10-01 DIAGNOSIS — M79675 Pain in left toe(s): Secondary | ICD-10-CM | POA: Diagnosis not present

## 2023-10-01 NOTE — Progress Notes (Signed)
  Subjective:  Patient ID: Christian Gordon, male    DOB: May 25, 1945,  MRN: [id number]  Chief Complaint  Patient presents with   Nail Problem    RM 1 RFC    78 y.o. male presents with the above complaint. History confirmed with patient.  His nails are thickened elongated and causing discomfort.  Prior debridements have been helpful in reducing pain and improving function  Objective:  Physical Exam: warm, good capillary refill, no trophic changes or ulcerative lesions, normal DP and PT pulses, and normal sensory exam. Left Foot: dystrophic yellowed discolored nail plates with subungual debris Right Foot: dystrophic yellowed discolored nail plates with subungual debris  Assessment:   1. Pain due to onychomycosis of toenails of both feet      Plan:  Patient was evaluated and treated and all questions answered.  Discussed the etiology and treatment options for the condition in detail with the patient. . Recommended debridement of the nails today. Sharp and mechanical debridement performed of all painful and mycotic nails today. Nails debrided in length and thickness using a nail nipper to level of comfort. Discussed treatment options including appropriate shoe gear.    Return in about 3 months (around 12/31/2023) for painful thick fungal nails.

## 2023-11-26 ENCOUNTER — Other Ambulatory Visit: Payer: Self-pay | Admitting: Nurse Practitioner

## 2023-11-26 DIAGNOSIS — J449 Chronic obstructive pulmonary disease, unspecified: Secondary | ICD-10-CM

## 2023-12-31 ENCOUNTER — Ambulatory Visit: Admitting: Podiatry

## 2023-12-31 DIAGNOSIS — B351 Tinea unguium: Secondary | ICD-10-CM | POA: Diagnosis not present

## 2023-12-31 DIAGNOSIS — M79675 Pain in left toe(s): Secondary | ICD-10-CM | POA: Diagnosis not present

## 2023-12-31 DIAGNOSIS — M79674 Pain in right toe(s): Secondary | ICD-10-CM

## 2023-12-31 NOTE — Progress Notes (Signed)
°  Subjective:  Patient ID: Christian Gordon, male    DOB: Jan 30, 1945,  MRN: [id number]  Chief Complaint  Patient presents with   Nail Problem    RFC    78 y.o. male presents with the above complaint. History confirmed with patient.  His nails are thickened elongated and causing discomfort.  Prior debridements have been helpful in reducing pain and improving function.  Has had sharp significant pain in the left hallux nail recently but no drainage  Objective:  Physical Exam: warm, good capillary refill, no trophic changes or ulcerative lesions, normal DP and PT pulses, and normal sensory exam. Left Foot: dystrophic yellowed discolored nail plates with subungual debris Right Foot: dystrophic yellowed discolored nail plates with subungual debris  Assessment:   1. Pain due to onychomycosis of toenails of both feet      Plan:  Patient was evaluated and treated and all questions answered.  Discussed the etiology and treatment options for the condition in detail with the patient. . Recommended debridement of the nails today. Sharp and mechanical debridement performed of all painful and mycotic nails today. Nails debrided in length and thickness using a nail nipper to level of comfort. Discussed treatment options including appropriate shoe gear.   I debrided the left hallux nail back and he had some hyperkeratosis digging into the nailbed deep to the nail plate this alleviated his pain advised him to soak the toe and apply Vaseline or Neosporin to the nailbed for the next week.  Discussed removal of the nail plate if this continues to recur Return in about 3 months (around 03/30/2024) for painful thick fungal nails.

## 2024-01-19 ENCOUNTER — Encounter: Payer: Self-pay | Admitting: General Practice

## 2024-01-19 ENCOUNTER — Ambulatory Visit: Admitting: General Practice

## 2024-01-19 ENCOUNTER — Ambulatory Visit: Payer: Self-pay

## 2024-01-19 VITALS — BP 130/62 | HR 78 | Temp 98.2°F | Ht 69.0 in | Wt 160.2 lb

## 2024-01-19 DIAGNOSIS — H00011 Hordeolum externum right upper eyelid: Secondary | ICD-10-CM | POA: Diagnosis not present

## 2024-01-19 DIAGNOSIS — S0990XA Unspecified injury of head, initial encounter: Secondary | ICD-10-CM | POA: Diagnosis not present

## 2024-01-19 MED ORDER — ERYTHROMYCIN 5 MG/GM OP OINT: 1.0000 | TOPICAL_OINTMENT | Freq: Four times a day (QID) | OPHTHALMIC | 0 refills | Status: AC

## 2024-01-19 NOTE — Patient Instructions (Addendum)
 Start erythromycin ointment four times a day for seven days.   Continue warm compresses.  Let me know if symptoms do not improve.   -Carrol Aurora, DNP, AGNP-C 01/19/2024 3:12 PM

## 2024-01-19 NOTE — Telephone Encounter (Signed)
" ° °  Copied from CRM #[id number]. Topic: Clinical - Red Word Triage >> Jan 19, 2024  8:05 AM Ivette P wrote: Red Word that prompted transfer to Nurse Triage: swollen under right eye, not a stye. puffy and hurts. Reason for Disposition  Eyelid is red and painful (or tender to touch)  Answer Assessment - Initial Assessment Questions Contacted CAL, Randall, at [facility].  Appt scheduled for 01/19/2024 at 3pm by CAL  1. ONSET: When did the swelling start? (e.g., minutes, hours, days)     One day ago 2. LOCATION: What part of the eyelids is swollen?     Lower lid of right eye 3. SEVERITY: How swollen is it?     Puffy only 4. ITCHING: Is there any itching? If Yes, ask: How much?   (Scale 1-10; mild, moderate or severe)     denies 5. PAIN: Is the swelling painful to touch? If Yes, ask: How painful is it?   (Scale 1-10; mild, moderate or severe)     Moderate, throbbing  7. CAUSE: What do you think is causing the swelling?     unknown 8. RECURRENT SYMPTOM: Have you had eyelid swelling before? If Yes, ask: When was the last time? What happened that time?     denies 9. OTHER SYMPTOMS: Do you have any other symptoms? (e.g., blurred vision, eye discharge, rash, runny nose)     denies  Protocols used: Eye - Swelling-A-AH  "

## 2024-01-19 NOTE — Progress Notes (Signed)
 "  Established Patient Office Visit  Subjective   Patient ID: Christian Gordon, male    DOB: 03/13/1945  Age: 77 y.o. MRN: [id number]  Chief Complaint  Patient presents with   Eye Problem    Right eye itching, drainage and swelling since Saturday. Patient has been applying tea bags to his eye and that has helped the itching.     Eye Problem  Associated symptoms include an eye discharge and eye redness. Pertinent negatives include no fever, nausea or vomiting.    Christian Gordon is a 78 year old male, patient of Adina Crandall, NP, presents today for an acute visit to discuss right eye itching.   Discussed the use of AI scribe software for clinical note transcription with the patient, who gave verbal consent to proceed.  History of Present Illness Christian Gordon is an 78 year old male who presents with swelling and pain around the eye.  Symptoms began on Saturday with tearing and swelling around the eye, worsening by "Sunday morning with increased pain and redness. The swelling was significant, described as 'the bag over here was down here'. Application of a tea bag provided significant relief of pain and discomfort.  No drainage, gunk, or crusting of the eyes in the morning. Pain is localized around the eye, not within it. No blurred vision, double vision, or light sensitivity. No fever, chills, ear pain, congestion, sinus pain, cough, shortness of breath, stomach symptoms, or urinary symptoms.  He recalls a fall around Christmas Eve while moving a box, resulting in hitting his head on a counter. He did not lose consciousness but experienced a shock. No severe headaches, nausea, or confusion followed.    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   Bunion of right foot 10/06/2019   Pruritus 10/06/2019   Liver lesion 07/22/2019   Atherosclerosis 07/22/2019   Carotid artery stenosis 06/28/2019   History of prostate cancer 06/28/2019   Hyperlipidemia 06/28/2019   Essential hypertension 06/28/2019   Deafness 06/28/2019   COPD (chronic obstructive pulmonary disease) (HCC) 06/28/2019   Tobacco abuse 06/28/2019   Past Medical History:  Diagnosis Date   Deaf, right    Dizzinesses    Hyperlipidemia    Hypertension    Prostate cancer (HCC)    SOB (shortness of breath)    Past Surgical History:  Procedure Laterality Date   DENTAL SURGERY     Allergies[1]       12" /29/2025    2:59 PM 07/31/2023   10:47 AM 03/04/2023   11:13 AM  Depression screen PHQ 2/9  Decreased Interest 0 1 0  Down, Depressed, Hopeless 0 0 0  PHQ - 2 Score 0 1 0  Altered sleeping 0 0 0  Tired, decreased energy 0 1 0  Change in appetite 0 0 0  Feeling bad or failure about yourself  0 0 0  Trouble concentrating 0 0 0  Moving slowly or fidgety/restless 0 0 0  Suicidal thoughts 0 0 0  PHQ-9 Score 0 2  0   Difficult doing work/chores Not difficult at all Not difficult at all Not difficult  at all     Data saved with a previous flowsheet row definition       01/19/2024    3:00 PM 07/31/2023   10:47 AM 03/04/2023   11:14 AM 07/22/2022    2:04 PM  GAD 7 : Generalized Anxiety Score  Nervous, Anxious, on Edge 0 0 0 0  Control/stop worrying 0 0 0 0  Worry too much - different things 0 0 0 1  Trouble relaxing 0 0 0 0  Restless 0 0 0 0  Easily annoyed or irritable 0 0 0 0  Afraid - awful might happen 0 0 0 0  Total GAD 7 Score 0 0 0 1  Anxiety Difficulty Not difficult at all Not difficult at all Not difficult at all Not difficult at all      Review of Systems  Constitutional:  Negative for chills and  fever.  HENT:  Negative for congestion, ear pain and sinus pain.   Eyes:  Positive for pain, discharge and redness.  Respiratory:  Negative for cough and shortness of breath.   Cardiovascular:  Negative for chest pain.  Gastrointestinal:  Negative for abdominal pain, constipation, diarrhea, heartburn, nausea and vomiting.  Genitourinary:  Negative for dysuria, frequency and urgency.  Neurological:  Negative for dizziness and headaches.  Endo/Heme/Allergies:  Negative for polydipsia.  Psychiatric/Behavioral:  Negative for depression and suicidal ideas. The patient is not nervous/anxious.       Objective:     BP 130/62   Pulse 78   Temp 98.2 F (36.8 C) (Temporal)   Ht 5' 9 (1.753 m)   Wt 160 lb 3.2 oz (72.7 kg)   SpO2 95%   BMI 23.66 kg/m  BP Readings from Last 3 Encounters:  01/19/24 130/62  07/31/23 (!) 146/72  03/04/23 124/78   Wt Readings from Last 3 Encounters:  01/19/24 160 lb 3.2 oz (72.7 kg)  10/01/23 163 lb 6.4 oz (74.1 kg)  07/31/23 163 lb 6.4 oz (74.1 kg)      Physical Exam Vitals and nursing note reviewed.  Constitutional:      Appearance: Normal appearance.  Eyes:     General:        Right eye: Hordeolum present.        Left eye: No hordeolum.     Extraocular Movements:     Right eye: Normal extraocular motion.     Left eye: Normal extraocular motion.     Pupils: Pupils are equal, round, and reactive to light.   Cardiovascular:     Rate and Rhythm: Normal rate and regular rhythm.     Pulses: Normal pulses.     Heart sounds: Normal heart sounds.  Pulmonary:     Effort: Pulmonary effort is normal.     Breath sounds: Normal breath sounds.  Neurological:     General: No focal deficit present.     Mental Status: He is alert and oriented to person, place, and time.     Cranial Nerves: Cranial nerves 2-12 are intact.     Motor: Motor function is intact.     Coordination: Coordination is intact.     Gait: Gait is intact.  Psychiatric:        Mood  and Affect: Mood normal.        Behavior: Behavior normal.        Thought Content: Thought content normal.        Judgment: Judgment normal.      No results found for any  visits on 01/19/24.     The ASCVD Risk score (Arnett DK, et al., 2019) failed to calculate for the following reasons:   The 2019 ASCVD risk score is only valid for ages 52 to 53   * - Cholesterol units were assumed    Assessment & Plan:  Hordeolum externum of right upper eyelid -     Erythromycin; Place 1 Application into the right eye 4 (four) times daily for 7 days.  Dispense: 28 g; Refill: 0  Injury of head, initial encounter    Assessment and Plan Assessment & Plan Hordeolum of right upper eyelid Swelling and pain in the right upper eyelid suggest a sty. No signs of conjunctivitis. Mild cellulitis present. - Prescribed erythromycin ointment for the right upper eyelid. Rx sent.  - Instructed to apply warm compresses to reduce swelling. - Advised to monitor symptoms and contact the office if no improvement by next Monday for potential oral antibiotics.  Head injury No immediate symptoms of concussion. No imaging required unless symptoms develop. - neuro exam stable. No red flags on exam today. - Advised to monitor for symptoms of concussion such as severe headache, nausea, or confusion. - Instructed to seek medical attention if symptoms develop.   Return if symptoms worsen or fail to improve.    Carrol Aurora, NP    [1] No Known Allergies  "

## 2024-01-23 ENCOUNTER — Telehealth: Payer: Self-pay

## 2024-01-23 DIAGNOSIS — L03213 Periorbital cellulitis: Secondary | ICD-10-CM

## 2024-01-23 NOTE — Telephone Encounter (Signed)
 Patient was seen last by [facility]. She is not in office to review. Please advise

## 2024-01-23 NOTE — Telephone Encounter (Signed)
 Copied from CRM [id number]. Topic: Clinical - Medication Question >> Jan 23, 2024 10:27 AM Harlene ORN wrote: Reason for CRM: Patient was seen last Monday for an eye infection and was given a salve to use 4 times a day for treatment. Was told if the salve isn't working, to call back to be prescribed an antibiotic. Patient is calling back asking for the antibiotic. He says his eye issues is not getting worse but not getting better either. Says he feel like there is something in his eye underneath his eyelid that is irritating him and won't go away

## 2024-01-23 NOTE — Telephone Encounter (Signed)
 Since I did not evaluate the patient and she did not mention as to which abx she would send in this needs to wait for her return. Give ED and UC precautions please

## 2024-01-26 MED ORDER — CEPHALEXIN 500 MG PO CAPS: 1000.0000 mg | ORAL_CAPSULE | Freq: Two times a day (BID) | ORAL | 0 refills | Status: AC

## 2024-01-26 NOTE — Addendum Note (Signed)
 Addended by: VINCENTE SHIVERS on: 01/26/2024 12:58 PM   Modules accepted: Orders

## 2024-01-26 NOTE — Telephone Encounter (Signed)
 Contacted and spoke with patient Informed him of script of abx sent in for eye infection. Pt verbalized understanding Pt has follow up scheduled.

## 2024-02-02 ENCOUNTER — Ambulatory Visit: Admitting: Nurse Practitioner

## 2024-02-02 VITALS — BP 120/70 | HR 80 | Temp 97.8°F | Ht 69.0 in | Wt 161.8 lb

## 2024-02-02 DIAGNOSIS — J449 Chronic obstructive pulmonary disease, unspecified: Secondary | ICD-10-CM | POA: Diagnosis not present

## 2024-02-02 DIAGNOSIS — I6529 Occlusion and stenosis of unspecified carotid artery: Secondary | ICD-10-CM | POA: Diagnosis not present

## 2024-02-02 DIAGNOSIS — R2689 Other abnormalities of gait and mobility: Secondary | ICD-10-CM | POA: Diagnosis not present

## 2024-02-02 DIAGNOSIS — I10 Essential (primary) hypertension: Secondary | ICD-10-CM | POA: Diagnosis not present

## 2024-02-02 DIAGNOSIS — I7090 Unspecified atherosclerosis: Secondary | ICD-10-CM | POA: Diagnosis not present

## 2024-02-02 NOTE — Progress Notes (Signed)
 "  Established Patient Office Visit  Subjective   Patient ID: Christian Gordon, male    DOB: [birth year]  Age: 79 y.o. MRN: [id number]  Chief Complaint  Patient presents with   Annual Exam   Follow-up    HPI  Discussed the use of AI scribe software for clinical note transcription with the patient, who gave verbal consent to proceed.  History of Present Illness Christian Gordon is an 79 year old male who presents for a six-month follow-up visit.  He experiences episodes of feeling 'unsettled' and having difficulty maintaining balance approximately once a month. These episodes do not involve falls but cause trouble steadying himself when standing up. Each episode resolves after about thirty seconds. No falls have been reported despite feeling off balance.  He has a history of an eye infection for which he was prescribed oral antibiotics (Keflex ). He completed the course of medication, and today is the last day of treatment. His eye feels watery but is the best it has felt since the infection began two weeks ago. Initially, the eye was matted on the first day of infection, but there has been no matting since. He suspects the infection may have been caused by accidentally transferring triamcinolone  cream into his eye. No current matting or crusting is present.  He experiences shortness of breath 'all the time' but has not needed to use his albuterol  inhaler recently. No worsening of his usual symptoms.  He reports a fear of going out, feeling anxious about potential accidents while driving or being robbed in stores. He scores low on an anxiety survey but notes that he has been sleeping only four hours at a time for the past two years, often taking naps during the day.  He mentions ongoing issues with his back, despite receiving hip injections at [facility]. While the hip pain has improved, the middle of his back continues to bother him.     02/02/2024   11:22 AM 01/19/2024    2:59 PM  07/31/2023   10:47 AM  PHQ9 SCORE ONLY  PHQ-9 Total Score 2 0 2      Data saved with a previous flowsheet row definition       02/02/2024   11:22 AM 01/19/2024    3:00 PM 07/31/2023   10:47 AM 03/04/2023   11:14 AM  GAD 7 : Generalized Anxiety Score  Nervous, Anxious, on Edge 0 0 0 0  Control/stop worrying 0 0 0 0  Worry too much - different things 0 0 0 0  Trouble relaxing 0 0 0 0  Restless 0 0 0 0  Easily annoyed or irritable 0 0 0 0  Afraid - awful might happen 1 0 0 0  Total GAD 7 Score 1 0 0 0  Anxiety Difficulty Not difficult at all Not difficult at all Not difficult at all Not difficult at all        Review of Systems  Constitutional:  Negative for chills and fever.  Eyes:  Positive for discharge. Negative for pain.  Respiratory:  Positive for shortness of breath.   Cardiovascular:  Negative for chest pain.  Neurological:  Negative for dizziness and headaches.  Psychiatric/Behavioral:  Negative for hallucinations and suicidal ideas.       Objective:     BP 120/70   Pulse 80   Temp 97.8 F (36.6 C) (Oral)   Ht 5' 9 (1.753 m)   Wt 161 lb 12.8 oz (73.4 kg)   SpO2 95%  BMI 23.89 kg/m  BP Readings from Last 3 Encounters:  02/02/24 120/70  01/19/24 130/62  07/31/23 (!) 146/72   Wt Readings from Last 3 Encounters:  02/02/24 161 lb 12.8 oz (73.4 kg)  01/19/24 160 lb 3.2 oz (72.7 kg)  10/01/23 163 lb 6.4 oz (74.1 kg)   SpO2 Readings from Last 3 Encounters:  02/02/24 95%  01/19/24 95%  07/31/23 96%      Physical Exam Vitals and nursing note reviewed.  Constitutional:      Appearance: Normal appearance.  Neck:     Vascular: No carotid bruit.  Cardiovascular:     Rate and Rhythm: Normal rate and regular rhythm.     Heart sounds: Normal heart sounds.  Pulmonary:     Effort: Pulmonary effort is normal.     Breath sounds: Normal breath sounds.  Neurological:     General: No focal deficit present.     Mental Status: He is alert.     Deep Tendon  Reflexes:     Reflex Scores:      Bicep reflexes are 2+ on the right side and 2+ on the left side.      Patellar reflexes are 2+ on the right side and 2+ on the left side.    Comments: Bilateral upper and lower extremity strength 5/5      No results found for any visits on 02/02/24.    The ASCVD Risk score (Arnett DK, et al., 2019) failed to calculate for the following reasons:   The 2019 ASCVD risk score is only valid for ages 71 to 54   * - Cholesterol units were assumed    Assessment & Plan:   Problem List Items Addressed This Visit       Cardiovascular and Mediastinum   Carotid artery stenosis - Primary   Relevant Orders   US  Carotid Duplex Bilateral   Essential hypertension   Relevant Orders   US  Carotid Duplex Bilateral   Atherosclerosis   Relevant Orders   US  Carotid Duplex Bilateral     Respiratory   COPD (chronic obstructive pulmonary disease) (HCC)   Relevant Orders   US  Carotid Duplex Bilateral   Other Visit Diagnoses       Balance disorder          Assessment and Plan Assessment & Plan Dizziness and imbalance Intermittent dizziness and imbalance once a month, resolving within 30 seconds. Last carotid check in 2022. - Ordered carotid artery ultrasound at the hospital off of [facility] in [place]. - Provided phone number for scheduling the ultrasound.  Essential hypertension Blood pressure well-controlled with current medication. Dizziness or lightheadedness reported at home about once a month, described as feeling unsettled and having difficulty steadying himself while standing up.  Chronic obstructive pulmonary disease Breathing well-managed, no rescue inhaler use. Constant shortness of breath without worsening. Mild wheeze noted. - Continue Wixela as prescribed and albuterol  inhaler as needed  Right eye infection (resolved sty) Previously treated with Keflex , symptoms improved. Mild watering noted. - Continue to monitor symptoms and  consider follow-up with an eye specialist if symptoms persist.  Anxiety Anxiety related to fear of accidents and robbery, low anxiety survey score. No medication needed. Therapy discussed. - Will consider therapy if anxiety symptoms worsen.  Insomnia Chronic insomnia with difficulty sleeping more than four hours. Daytime naps noted. Anxiety not contributing. - Continue current sleep management strategies.  General health maintenance No blood work needed today, previous results satisfactory. - Will schedule follow-up in  six months for physical examination and blood work.   Return in about 6 months (around 08/01/2024) for CPE and Labs.    Adina Crandall, NP  "

## 2024-02-02 NOTE — Patient Instructions (Signed)
 Nice to see you today Call and set up the ultrasound at   [facility] - enter through [facility] [address] [place], [place], [address] Open 7am-5pm [phone]  Follow up with me in 6 months, sooner If you need me

## 2024-02-05 ENCOUNTER — Ambulatory Visit
Admission: RE | Admit: 2024-02-05 | Discharge: 2024-02-05 | Disposition: A | Discharge status: Home / Self Care | Source: Ambulatory Visit | Attending: Nurse Practitioner | Admitting: Nurse Practitioner

## 2024-02-05 DIAGNOSIS — I10 Essential (primary) hypertension: Secondary | ICD-10-CM | POA: Insufficient documentation

## 2024-02-05 DIAGNOSIS — I7090 Unspecified atherosclerosis: Secondary | ICD-10-CM | POA: Insufficient documentation

## 2024-02-05 DIAGNOSIS — J449 Chronic obstructive pulmonary disease, unspecified: Secondary | ICD-10-CM | POA: Insufficient documentation

## 2024-02-05 DIAGNOSIS — I6529 Occlusion and stenosis of unspecified carotid artery: Secondary | ICD-10-CM | POA: Diagnosis present

## 2024-02-06 ENCOUNTER — Other Ambulatory Visit: Payer: Self-pay | Admitting: Nurse Practitioner

## 2024-02-06 DIAGNOSIS — R3915 Urgency of urination: Secondary | ICD-10-CM

## 2024-02-08 ENCOUNTER — Ambulatory Visit: Payer: Self-pay | Admitting: Nurse Practitioner

## 2024-03-22 ENCOUNTER — Ambulatory Visit: Admitting: Podiatry

## 2024-08-02 ENCOUNTER — Encounter: Admitting: Nurse Practitioner
